# Patient Record
Sex: Female | Born: 1945 | Race: Black or African American | Hispanic: No | Smoking: Former smoker
Health system: Southern US, Community
[De-identification: ages and names within clinical notes are randomized; demographics above are authoritative.]

## PROBLEM LIST (undated history)

## (undated) DIAGNOSIS — I714 Abdominal aortic aneurysm, without rupture, unspecified: Secondary | ICD-10-CM

## (undated) DIAGNOSIS — I10 Essential (primary) hypertension: Secondary | ICD-10-CM

## (undated) DIAGNOSIS — F419 Anxiety disorder, unspecified: Secondary | ICD-10-CM

## (undated) DIAGNOSIS — E119 Type 2 diabetes mellitus without complications: Secondary | ICD-10-CM

## (undated) DIAGNOSIS — D649 Anemia, unspecified: Secondary | ICD-10-CM

## (undated) DIAGNOSIS — J45909 Unspecified asthma, uncomplicated: Secondary | ICD-10-CM

## (undated) DIAGNOSIS — E785 Hyperlipidemia, unspecified: Secondary | ICD-10-CM

## (undated) DIAGNOSIS — M1990 Unspecified osteoarthritis, unspecified site: Secondary | ICD-10-CM

## (undated) HISTORY — PX: APPENDECTOMY: SHX54

## (undated) HISTORY — DX: Type 2 diabetes mellitus without complications: E11.9

## (undated) HISTORY — DX: Abdominal aortic aneurysm, without rupture: I71.4

## (undated) HISTORY — PX: ABDOMINAL HYSTERECTOMY: SHX81

## (undated) HISTORY — DX: Anxiety disorder, unspecified: F41.9

## (undated) HISTORY — DX: Abdominal aortic aneurysm, without rupture, unspecified: I71.40

## (undated) HISTORY — DX: Hyperlipidemia, unspecified: E78.5

## (undated) HISTORY — PX: BUNIONECTOMY: SHX129

## (undated) HISTORY — DX: Essential (primary) hypertension: I10

## (undated) HISTORY — PX: GANGLION CYST EXCISION: SHX1691

## (undated) HISTORY — DX: Unspecified osteoarthritis, unspecified site: M19.90

## (undated) HISTORY — DX: Anemia, unspecified: D64.9

## (undated) HISTORY — DX: Unspecified asthma, uncomplicated: J45.909

---

## 2015-08-05 ENCOUNTER — Encounter: Payer: Self-pay | Admitting: Pediatrics

## 2015-08-05 ENCOUNTER — Ambulatory Visit (INDEPENDENT_AMBULATORY_CARE_PROVIDER_SITE_OTHER): Payer: Medicare Other | Admitting: Pediatrics

## 2015-08-05 VITALS — BP 118/83 | HR 71 | Temp 99.3°F | Ht 64.25 in | Wt 181.0 lb

## 2015-08-05 DIAGNOSIS — Z1159 Encounter for screening for other viral diseases: Secondary | ICD-10-CM | POA: Diagnosis not present

## 2015-08-05 DIAGNOSIS — H269 Unspecified cataract: Secondary | ICD-10-CM

## 2015-08-05 DIAGNOSIS — M19071 Primary osteoarthritis, right ankle and foot: Secondary | ICD-10-CM | POA: Diagnosis not present

## 2015-08-05 DIAGNOSIS — J4530 Mild persistent asthma, uncomplicated: Secondary | ICD-10-CM

## 2015-08-05 DIAGNOSIS — E538 Deficiency of other specified B group vitamins: Secondary | ICD-10-CM | POA: Insufficient documentation

## 2015-08-05 DIAGNOSIS — I10 Essential (primary) hypertension: Secondary | ICD-10-CM | POA: Diagnosis not present

## 2015-08-05 DIAGNOSIS — Z1239 Encounter for other screening for malignant neoplasm of breast: Secondary | ICD-10-CM | POA: Diagnosis not present

## 2015-08-05 DIAGNOSIS — M069 Rheumatoid arthritis, unspecified: Secondary | ICD-10-CM | POA: Insufficient documentation

## 2015-08-05 MED ORDER — ALBUTEROL SULFATE HFA 108 (90 BASE) MCG/ACT IN AERS: 2.0000 | INHALATION_SPRAY | Freq: Four times a day (QID) | RESPIRATORY_TRACT | Status: DC | PRN

## 2015-08-05 NOTE — Progress Notes (Signed)
Subjective:    Patient ID: Anne Strong, female    DOB: [birth year], 70 y.o.   MRN: [id number]  CC: follow up multiple med problems, establish care  HPI: Anne Strong is a 70 y.o. female presenting for New Patient (Initial Visit)  Moved in July from [place].  H/o B12 def: on B12 shots.   Dexa scan: In May or June IN [place], says was normal  Arthritis: takes aleve, pain in lower back when she first gets up in the morning, also in R hand, and R ankle with OA  Recently treated for a tooth infection with clindamycin at urgent care  Asthma: breathing well controlled, has been in hospital before but years  BMI: going to the [facility] 2-3 days a week  H/o "funny heart rhythm". Not on a blood thinner.  Colonoscopy last 2 years  Has had one pneumonia shot   Depression screen [facility] 2/9 08/05/2015  Decreased Interest 0  Down, Depressed, Hopeless 0  PHQ - 2 Score 0      ROS: All systems negative other than what is in HPI  Past Medical History  Diagnosis Date  . Anxiety   . Asthma   . Hypertension   . Hyperlipidemia   . Diabetes mellitus without complication ([place])   . Anemia   . Arthritis    Social History   Social History  . Marital Status: Divorced    Spouse Name: N/A  . Number of Children: N/A  . Years of Education: N/A   Occupational History  . Not on file.   Social History Main Topics  . Smoking status: Former Smoker    Types: Cigarettes    Quit date: 08/04/2012  . Smokeless tobacco: Not on file  . Alcohol Use: No  . Drug Use: No  . Sexual Activity: Not on file   Other Topics Concern  . Not on file   Social History Narrative  . No narrative on file   Fam hx: parents with HTN  Current Outpatient Prescriptions  Medication Sig Dispense Refill  . albuterol (PROVENTIL HFA;VENTOLIN HFA) 108 (90 Base) MCG/ACT inhaler Inhale 2 puffs into the lungs every 6 (six) hours as needed for wheezing or shortness of breath. 1 Inhaler 2  . amLODipine (NORVASC) 5 MG tablet       . calcium-vitamin D (OSCAL WITH D) 500-200 MG-UNIT tablet Take 1 tablet by mouth.    . metoprolol succinate (TOPROL-XL) 25 MG 24 hr tablet     . mometasone-formoterol (DULERA) 100-5 MCG/ACT AERO Inhale 2 puffs into the lungs 2 (two) times daily.    . montelukast (SINGULAIR) 10 MG tablet     . naproxen sodium (ANAPROX) 220 MG tablet Take 220 mg by mouth 2 (two) times daily with a meal.    . pravastatin (PRAVACHOL) 40 MG tablet     . ramipril (ALTACE) 10 MG capsule      No current facility-administered medications for this visit.       Objective:    BP 118/83 mmHg  Pulse 71  Temp(Src) 99.3 F (37.4 C) (Oral)  Ht 5' 4.25" (1.632 m)  Wt 181 lb (82.101 kg)  BMI 30.83 kg/m2  Wt Readings from Last 3 Encounters:  08/05/15 181 lb (82.101 kg)     Gen: NAD, alert, cooperative with exam, NCAT EYES: EOMI, no scleral injection or icterus ENT:  TMs pearly gray b/l, OP without erythema LYMPH: no cervical LAD CV: NRRR, normal S1/S2, no murmur, distal pulses 2+  b/l Resp: CTABL, no wheezes, normal WOB Abd: +BS, soft, NTND. no guarding or organomegaly Ext: No edema, warm Neuro: Alert and oriented, strength equal b/l UE and LE, coordination grossly normal MSK: normal muscle bulk     Assessment & Plan:    Aeriel was seen today for multiple med problem f/u.  Diagnoses and all orders for this visit:  Asthma, mild persistent, uncomplicated Was using controller med as a prn. Start Dulera twice a day every day. Albuterol as needed. Stop pulmicort. -     albuterol (PROVENTIL HFA;VENTOLIN HFA) 108 (90 Base) MCG/ACT inhaler; Inhale 2 puffs into the lungs every 6 (six) hours as needed for wheezing or shortness of breath.  B12 deficiency Was getting injections, last 9 mo ago, will check level. -     Vitamin B12  Osteoarthritis of right ankle, unspecified osteoarthritis type Naproxen as needed  Need for hepatitis C screening test -     Hepatitis C antibody  Screening for breast cancer -      MM DIGITAL SCREENING BILATERAL; Future  Essential hypertension Continue current meds. Will check below labs.  -     CMP14+EGFR -     CBC  Cataract -     Ambulatory referral to Ophthalmology  F/u with dentist for tooth infection. FInish abx from Urgent care   Follow up plan: Return in about 4 weeks (around 09/02/2015) for f/u medical problems. F/u ?heart problems after get records from [place], may need EKG, ?atrial fibrillation from pt's description vs PVCs  Assunta Found, MD [facility] 08/05/2015, 7:29 PM

## 2015-08-06 LAB — CBC
HEMATOCRIT: 33.1 % — AB (ref 34.0–46.6)
HEMOGLOBIN: 10.5 g/dL — AB (ref 11.1–15.9)
MCH: 27.6 pg (ref 26.6–33.0)
MCHC: 31.7 g/dL (ref 31.5–35.7)
MCV: 87 fL (ref 79–97)
Platelets: 216 10*3/uL (ref 150–379)
RBC: 3.8 x10E6/uL (ref 3.77–5.28)
RDW: 15 % (ref 12.3–15.4)
WBC: 5.8 10*3/uL (ref 3.4–10.8)

## 2015-08-06 LAB — VITAMIN B12: Vitamin B-12: 482 pg/mL (ref 211–946)

## 2015-08-06 LAB — CMP14+EGFR
ALT: 9 IU/L (ref 0–32)
AST: 12 IU/L (ref 0–40)
Albumin/Globulin Ratio: 1.3 (ref 1.1–2.5)
Albumin: 3.7 g/dL (ref 3.6–4.8)
Alkaline Phosphatase: 57 IU/L (ref 39–117)
BUN/Creatinine Ratio: 21 (ref 11–26)
BUN: 27 mg/dL (ref 8–27)
Bilirubin Total: <0.2 mg/dL (ref 0.0–1.2)
CALCIUM: 9.1 mg/dL (ref 8.7–10.3)
CHLORIDE: 99 mmol/L (ref 96–106)
CO2: 23 mmol/L (ref 18–29)
Creatinine, Ser: 1.3 mg/dL — ABNORMAL HIGH (ref 0.57–1.00)
GFR, EST AFRICAN AMERICAN: 49 mL/min/{1.73_m2} — AB (ref >59)
GFR, EST NON AFRICAN AMERICAN: 43 mL/min/{1.73_m2} — AB (ref >59)
GLUCOSE: 98 mg/dL (ref 65–99)
Globulin, Total: 2.9 g/dL (ref 1.5–4.5)
Potassium: 4.8 mmol/L (ref 3.5–5.2)
Sodium: 139 mmol/L (ref 134–144)
TOTAL PROTEIN: 6.6 g/dL (ref 6.0–8.5)

## 2015-08-06 LAB — HEPATITIS C ANTIBODY

## 2015-08-06 LAB — PLEASE NOTE

## 2015-08-09 ENCOUNTER — Telehealth: Payer: Self-pay | Admitting: Pediatrics

## 2015-08-09 MED ORDER — ALBUTEROL SULFATE HFA 108 (90 BASE) MCG/ACT IN AERS: 2.0000 | INHALATION_SPRAY | Freq: Four times a day (QID) | RESPIRATORY_TRACT | Status: DC | PRN

## 2015-08-09 NOTE — Telephone Encounter (Signed)
I resent in a generic albuterol. The pharmacist may be able to tell you which inhaler is preferred by your insurance company and if it isnt this new one I sent in let me know which one is best and I can send that in. Unfortunately I can't tell from my end what is covered and what is not by her insurance so sometimes we have to do trial and error with the pharmacy, but the pharmacist should be able to help if this doesn't go through.

## 2015-08-11 NOTE — Telephone Encounter (Signed)
Pt aware other medication has been sent to pharmacy

## 2015-08-23 ENCOUNTER — Encounter: Payer: Self-pay | Admitting: Pediatrics

## 2015-08-23 DIAGNOSIS — I700 Atherosclerosis of aorta: Secondary | ICD-10-CM | POA: Insufficient documentation

## 2015-09-16 ENCOUNTER — Ambulatory Visit (INDEPENDENT_AMBULATORY_CARE_PROVIDER_SITE_OTHER): Payer: Medicare Other | Admitting: Pediatrics

## 2015-09-16 ENCOUNTER — Encounter: Payer: Self-pay | Admitting: Pediatrics

## 2015-09-16 ENCOUNTER — Encounter (INDEPENDENT_AMBULATORY_CARE_PROVIDER_SITE_OTHER): Payer: Self-pay

## 2015-09-16 VITALS — BP 104/68 | HR 75 | Temp 98.7°F | Ht 64.25 in | Wt 186.2 lb

## 2015-09-16 DIAGNOSIS — J4530 Mild persistent asthma, uncomplicated: Secondary | ICD-10-CM

## 2015-09-16 DIAGNOSIS — I10 Essential (primary) hypertension: Secondary | ICD-10-CM | POA: Diagnosis not present

## 2015-09-16 DIAGNOSIS — I700 Atherosclerosis of aorta: Secondary | ICD-10-CM | POA: Diagnosis not present

## 2015-09-16 DIAGNOSIS — Z1239 Encounter for other screening for malignant neoplasm of breast: Secondary | ICD-10-CM | POA: Diagnosis not present

## 2015-09-16 MED ORDER — METOPROLOL SUCCINATE ER 25 MG PO TB24: 25.0000 mg | ORAL_TABLET | Freq: Every day | ORAL | Status: DC

## 2015-09-16 MED ORDER — AMLODIPINE BESYLATE 5 MG PO TABS: 5.0000 mg | ORAL_TABLET | Freq: Every day | ORAL | Status: DC

## 2015-09-16 MED ORDER — RAMIPRIL 10 MG PO CAPS: 10.0000 mg | ORAL_CAPSULE | Freq: Every day | ORAL | Status: DC

## 2015-09-16 MED ORDER — PRAVASTATIN SODIUM 40 MG PO TABS: 40.0000 mg | ORAL_TABLET | Freq: Every day | ORAL | Status: DC

## 2015-09-16 MED ORDER — MONTELUKAST SODIUM 10 MG PO TABS: 10.0000 mg | ORAL_TABLET | Freq: Every day | ORAL | Status: DC

## 2015-09-16 NOTE — Progress Notes (Signed)
Subjective:    Patient ID: Anne Strong, female    DOB: Nov 21, 1945, 70 y.o.   MRN: [id number]  CC: Follow-up multiple med problems  HPI: Anne Strong is a 70 y.o. female presenting for Follow-up  Doing well No further problems with breathing Needed albuterol in Dec when visiting family Anne Strong was too expensive from pharmacy Not needed albuterol recently No fevers  No headaches or vision changes  Taking cholesterol medicine regularly   Depression screen [facility] 2/9 09/16/2015 08/05/2015  Decreased Interest 0 0  Down, Depressed, Hopeless 0 0  PHQ - 2 Score 0 0     Relevant past medical, surgical, family and social history reviewed and updated as indicated. Interim medical history since our last visit reviewed. Allergies and medications reviewed and updated.    ROS: Per HPI unless specifically indicated above  History  Smoking status  . Former Smoker  . Types: Cigarettes  . Quit date: 08/04/2012  Smokeless tobacco  . Not on file    Past Medical History Patient Active Problem List   Diagnosis Date Noted  . Aortic atherosclerosis ([place]) 08/23/2015  . B12 deficiency 08/05/2015  . Rheumatoid arthritis ([place]) 08/05/2015  . Asthma, mild persistent 08/05/2015  . Essential hypertension 08/05/2015  . Cataract 08/05/2015       Objective:    BP 104/68 mmHg  Pulse 75  Temp(Src) 98.7 F (37.1 C) (Oral)  Ht 5' 4.25" (1.632 m)  Wt 186 lb 3.2 oz (84.46 kg)  BMI 31.71 kg/m2  Wt Readings from Last 3 Encounters:  09/16/15 186 lb 3.2 oz (84.46 kg)  08/05/15 181 lb (82.101 kg)     Gen: NAD, alert, cooperative with exam, NCAT EYES: EOMI, no scleral injection or icterus ENT:  TMs pearly gray b/l, OP without erythema LYMPH: no cervical LAD CV: NRRR, normal S1/S2, no murmur, distal pulses 2+ b/l Resp: CTABL, no wheezes, normal WOB Abd: +BS, soft, NTND. Ext: No edema, warm Neuro: Alert and oriented, strength equal b/l UE and LE, coordination grossly normal MSK: normal  muscle bulk     Assessment & Plan:    Delijah was seen today for follow-up multiple med problems.  Diagnoses and all orders for this visit:  Asthma, mild persistent, uncomplicated Well controlled if needing albuterol more regularly will let me know. Not taking dulera now, pt to check with insurance company to see how much it costs from mail order. -     montelukast (SINGULAIR) 10 MG tablet; Take 1 tablet (10 mg total) by mouth at bedtime.  Essential hypertension Well controlled, may need to be on less BP medicine. No symptoms of hypotension. Will check BP at home. -     metoprolol succinate (TOPROL-XL) 25 MG 24 hr tablet; Take 1 tablet (25 mg total) by mouth daily. -     amLODipine (NORVASC) 5 MG tablet; Take 1 tablet (5 mg total) by mouth daily. -     ramipril (ALTACE) 10 MG capsule; Take 1 capsule (10 mg total) by mouth daily.  Aortic atherosclerosis (HCC) -     pravastatin (PRAVACHOL) 40 MG tablet; Take 1 tablet (40 mg total) by mouth daily.  Screening for breast cancer -     MM DIGITAL SCREENING BILATERAL; Future  Screening colon ca pt to get records from [facility]  Follow up plan: Return in about 5 months (around 02/16/2016) for Med f/u in 5 mo, mammogram bus schedule.  Assunta Found, MD [facility] 09/16/2015, 2:31 PM

## 2015-10-07 ENCOUNTER — Other Ambulatory Visit: Payer: Self-pay | Admitting: Pediatrics

## 2015-10-07 DIAGNOSIS — I10 Essential (primary) hypertension: Secondary | ICD-10-CM

## 2015-10-07 MED ORDER — METOPROLOL SUCCINATE ER 25 MG PO TB24: 25.0000 mg | ORAL_TABLET | Freq: Every day | ORAL | Status: DC

## 2015-10-07 NOTE — Telephone Encounter (Signed)
Printed rx and faxed

## 2016-01-03 ENCOUNTER — Encounter: Payer: Medicare Other | Admitting: *Deleted

## 2016-01-14 ENCOUNTER — Telehealth: Payer: Self-pay | Admitting: Pediatrics

## 2016-01-14 ENCOUNTER — Other Ambulatory Visit: Payer: Self-pay | Admitting: *Deleted

## 2016-01-14 DIAGNOSIS — I10 Essential (primary) hypertension: Secondary | ICD-10-CM

## 2016-01-14 DIAGNOSIS — I700 Atherosclerosis of aorta: Secondary | ICD-10-CM

## 2016-01-14 DIAGNOSIS — J4530 Mild persistent asthma, uncomplicated: Secondary | ICD-10-CM

## 2016-01-14 MED ORDER — ALBUTEROL SULFATE HFA 108 (90 BASE) MCG/ACT IN AERS: 2.0000 | INHALATION_SPRAY | Freq: Four times a day (QID) | RESPIRATORY_TRACT | Status: DC | PRN

## 2016-01-14 MED ORDER — PRAVASTATIN SODIUM 40 MG PO TABS: 40.0000 mg | ORAL_TABLET | Freq: Every day | ORAL | Status: DC

## 2016-01-14 MED ORDER — METOPROLOL SUCCINATE ER 25 MG PO TB24: 25.0000 mg | ORAL_TABLET | Freq: Every day | ORAL | Status: DC

## 2016-01-14 MED ORDER — RAMIPRIL 10 MG PO CAPS: 10.0000 mg | ORAL_CAPSULE | Freq: Every day | ORAL | Status: DC

## 2016-01-14 MED ORDER — AMLODIPINE BESYLATE 5 MG PO TABS: 5.0000 mg | ORAL_TABLET | Freq: Every day | ORAL | Status: DC

## 2016-01-14 MED ORDER — MONTELUKAST SODIUM 10 MG PO TABS: 10.0000 mg | ORAL_TABLET | Freq: Every day | ORAL | Status: DC

## 2016-01-14 NOTE — Telephone Encounter (Signed)
Patient needs refills of all her medications sent to [facility]  Patient last seen March 2017, please advise.

## 2016-02-04 ENCOUNTER — Telehealth: Payer: Self-pay | Admitting: Pediatrics

## 2016-02-14 ENCOUNTER — Encounter: Payer: Self-pay | Admitting: Pediatrics

## 2016-02-14 ENCOUNTER — Ambulatory Visit (INDEPENDENT_AMBULATORY_CARE_PROVIDER_SITE_OTHER): Payer: Medicare Other | Admitting: Pediatrics

## 2016-02-14 VITALS — BP 117/78 | HR 95 | Temp 98.0°F | Ht 64.25 in | Wt 184.2 lb

## 2016-02-14 DIAGNOSIS — M05731 Rheumatoid arthritis with rheumatoid factor of right wrist without organ or systems involvement: Secondary | ICD-10-CM

## 2016-02-14 DIAGNOSIS — J4530 Mild persistent asthma, uncomplicated: Secondary | ICD-10-CM | POA: Diagnosis not present

## 2016-02-14 DIAGNOSIS — I10 Essential (primary) hypertension: Secondary | ICD-10-CM | POA: Diagnosis not present

## 2016-02-14 DIAGNOSIS — I700 Atherosclerosis of aorta: Secondary | ICD-10-CM

## 2016-02-14 DIAGNOSIS — D649 Anemia, unspecified: Secondary | ICD-10-CM

## 2016-02-14 DIAGNOSIS — E538 Deficiency of other specified B group vitamins: Secondary | ICD-10-CM | POA: Diagnosis not present

## 2016-02-14 MED ORDER — METOPROLOL SUCCINATE ER 25 MG PO TB24: 25.0000 mg | ORAL_TABLET | Freq: Every day | ORAL | 0 refills | Status: DC

## 2016-02-14 MED ORDER — AMLODIPINE BESYLATE 5 MG PO TABS: 5.0000 mg | ORAL_TABLET | Freq: Every day | ORAL | 0 refills | Status: DC

## 2016-02-14 MED ORDER — MONTELUKAST SODIUM 10 MG PO TABS: 10.0000 mg | ORAL_TABLET | Freq: Every day | ORAL | 0 refills | Status: DC

## 2016-02-14 MED ORDER — RAMIPRIL 10 MG PO CAPS: 10.0000 mg | ORAL_CAPSULE | Freq: Every day | ORAL | 0 refills | Status: DC

## 2016-02-14 MED ORDER — PRAVASTATIN SODIUM 40 MG PO TABS: 40.0000 mg | ORAL_TABLET | Freq: Every day | ORAL | 0 refills | Status: DC

## 2016-02-14 NOTE — Progress Notes (Addendum)
    Subjective:    Patient ID: [name], female    DOB: [birth year], 70 y.o.   MRN: [id number]  CC: Follow-up multiple med problems  HPI: JACQUETTA POLHAMUS is a 70 y.o. female presenting for Follow-up  RA: affected R ankle, R wrist No recent symptoms No swelling joints  Breathing has been fine Using albuterol daily, not on controller medicine anymore  Normal stooling Good energy levels Has been with grandkids a lot this summer  No headaches, vision changes, or chest pain  Depression screen [facility] 2/9 02/14/2016 09/16/2015 08/05/2015  Decreased Interest 0 0 0  Down, Depressed, Hopeless 0 0 0  PHQ - 2 Score 0 0 0     Relevant past medical, surgical, family and social history reviewed and updated. Interim medical history since our last visit reviewed. Allergies and medications reviewed and updated.  History  Smoking Status  . Former Smoker  . Types: Cigarettes  . Quit date: 08/04/2012  Smokeless Tobacco  . Never Used    ROS: Per HPI      Objective:    BP 117/78 (BP Location: Right Arm, Patient Position: Sitting, Cuff Size: Large)   Pulse 95   Temp 98 F (36.7 C) (Oral)   Ht 5' 4.25" (1.632 m)   Wt 184 lb 3.2 oz (83.6 kg)   BMI 31.37 kg/m   Wt Readings from Last 3 Encounters:  02/14/16 184 lb 3.2 oz (83.6 kg)  09/16/15 186 lb 3.2 oz (84.5 kg)  08/05/15 181 lb (82.1 kg)     Gen: NAD, alert, cooperative with exam, NCAT EYES: EOMI, no scleral injection or icterus CV: NRRR, normal S1/S2, no murmur, distal pulses 2+ b/l Resp: CTABL, no wheezes, normal WOB Abd: +BS, soft, NTND. no guarding or organomegaly Ext: No edema, warm Neuro: Alert and oriented, strength equal b/l UE and LE, coordination grossly normal MSK: normal muscle bulk     Assessment & Plan:    Merrily was seen today for follow-up multiple med problems  Diagnoses and all orders for this visit:  Essential hypertension Well controlled Cont current meds -     BMP8+EGFR -     ramipril (ALTACE) 10  MG capsule; Take 1 capsule (10 mg total) by mouth daily. -     amLODipine (NORVASC) 5 MG tablet; Take 1 tablet (5 mg total) by mouth daily. -     metoprolol succinate (TOPROL-XL) 25 MG 24 hr tablet; Take 1 tablet (25 mg total) by mouth daily.  Asthma, mild persistent, uncomplicated Well controlled Use albuteorl only as needed -     montelukast (SINGULAIR) 10 MG tablet; Take 1 tablet (10 mg total) by mouth at bedtime.  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  Anemia, unspecified anemia type -     CBC with Differential/Platelet   Follow up plan: Return in about 6 months (around 08/16/2016).  Assunta Found, MD [facility] 02/14/2016, 2:14 PM

## 2016-02-14 NOTE — Patient Instructions (Signed)
Let me know if our medication list is not correct for you once you see the medication bottles at home.

## 2016-02-14 NOTE — Telephone Encounter (Signed)
Pt did not return call, will not order until we hear from her

## 2016-02-15 LAB — CBC WITH DIFFERENTIAL/PLATELET
Basophils Absolute: 0 10*3/uL (ref 0.0–0.2)
Basos: 1 %
EOS (ABSOLUTE): 0.2 10*3/uL (ref 0.0–0.4)
EOS: 4 %
HEMATOCRIT: 34.4 % (ref 34.0–46.6)
HEMOGLOBIN: 10.7 g/dL — AB (ref 11.1–15.9)
IMMATURE GRANS (ABS): 0 10*3/uL (ref 0.0–0.1)
IMMATURE GRANULOCYTES: 0 %
LYMPHS ABS: 1 10*3/uL (ref 0.7–3.1)
Lymphs: 20 %
MCH: 27.9 pg (ref 26.6–33.0)
MCHC: 31.1 g/dL — AB (ref 31.5–35.7)
MCV: 90 fL (ref 79–97)
MONOCYTES: 6 %
Monocytes Absolute: 0.3 10*3/uL (ref 0.1–0.9)
Neutrophils Absolute: 3.6 10*3/uL (ref 1.4–7.0)
Neutrophils: 69 %
Platelets: 225 10*3/uL (ref 150–379)
RBC: 3.84 x10E6/uL (ref 3.77–5.28)
RDW: 14.7 % (ref 12.3–15.4)
WBC: 5.2 10*3/uL (ref 3.4–10.8)

## 2016-02-15 LAB — BMP8+EGFR
BUN / CREAT RATIO: 19 (ref 12–28)
BUN: 24 mg/dL (ref 8–27)
CO2: 24 mmol/L (ref 18–29)
CREATININE: 1.25 mg/dL — AB (ref 0.57–1.00)
Calcium: 9.3 mg/dL (ref 8.7–10.3)
Chloride: 100 mmol/L (ref 96–106)
GFR calc Af Amer: 51 mL/min/{1.73_m2} — ABNORMAL LOW (ref >59)
GFR calc non Af Amer: 44 mL/min/{1.73_m2} — ABNORMAL LOW (ref >59)
GLUCOSE: 97 mg/dL (ref 65–99)
Potassium: 4.9 mmol/L (ref 3.5–5.2)
Sodium: 140 mmol/L (ref 134–144)

## 2016-02-16 ENCOUNTER — Other Ambulatory Visit: Payer: Self-pay

## 2016-02-16 DIAGNOSIS — I10 Essential (primary) hypertension: Secondary | ICD-10-CM

## 2016-02-16 DIAGNOSIS — I700 Atherosclerosis of aorta: Secondary | ICD-10-CM

## 2016-02-16 MED ORDER — RAMIPRIL 10 MG PO CAPS: 10.0000 mg | ORAL_CAPSULE | Freq: Every day | ORAL | 1 refills | Status: DC

## 2016-02-16 MED ORDER — PRAVASTATIN SODIUM 40 MG PO TABS: 40.0000 mg | ORAL_TABLET | Freq: Every day | ORAL | 1 refills | Status: DC

## 2016-02-16 MED ORDER — METOPROLOL SUCCINATE ER 25 MG PO TB24: 25.0000 mg | ORAL_TABLET | Freq: Every day | ORAL | 1 refills | Status: DC

## 2016-02-16 MED ORDER — AMLODIPINE BESYLATE 5 MG PO TABS: 5.0000 mg | ORAL_TABLET | Freq: Every day | ORAL | 1 refills | Status: DC

## 2016-02-16 MED ORDER — ALBUTEROL SULFATE HFA 108 (90 BASE) MCG/ACT IN AERS: 2.0000 | INHALATION_SPRAY | Freq: Four times a day (QID) | RESPIRATORY_TRACT | 3 refills | Status: DC | PRN

## 2016-06-13 ENCOUNTER — Other Ambulatory Visit: Payer: Self-pay | Admitting: Pediatrics

## 2016-06-13 DIAGNOSIS — J4530 Mild persistent asthma, uncomplicated: Secondary | ICD-10-CM

## 2016-07-04 ENCOUNTER — Other Ambulatory Visit: Payer: Self-pay

## 2016-07-04 DIAGNOSIS — J4530 Mild persistent asthma, uncomplicated: Secondary | ICD-10-CM

## 2016-07-04 MED ORDER — MONTELUKAST SODIUM 10 MG PO TABS: 10.0000 mg | ORAL_TABLET | Freq: Every day | ORAL | 0 refills | Status: DC

## 2016-07-20 ENCOUNTER — Other Ambulatory Visit: Payer: Self-pay | Admitting: *Deleted

## 2016-07-20 DIAGNOSIS — J4530 Mild persistent asthma, uncomplicated: Secondary | ICD-10-CM

## 2016-07-20 MED ORDER — MONTELUKAST SODIUM 10 MG PO TABS: 10.0000 mg | ORAL_TABLET | Freq: Every day | ORAL | 0 refills | Status: DC

## 2016-08-16 ENCOUNTER — Ambulatory Visit (INDEPENDENT_AMBULATORY_CARE_PROVIDER_SITE_OTHER): Payer: Medicare Other | Admitting: Pediatrics

## 2016-08-16 ENCOUNTER — Encounter: Payer: Self-pay | Admitting: Pediatrics

## 2016-08-16 ENCOUNTER — Encounter (INDEPENDENT_AMBULATORY_CARE_PROVIDER_SITE_OTHER): Payer: Self-pay

## 2016-08-16 VITALS — BP 102/74 | HR 71 | Temp 97.7°F | Ht 64.0 in | Wt 185.0 lb

## 2016-08-16 DIAGNOSIS — J4530 Mild persistent asthma, uncomplicated: Secondary | ICD-10-CM | POA: Diagnosis not present

## 2016-08-16 DIAGNOSIS — E538 Deficiency of other specified B group vitamins: Secondary | ICD-10-CM | POA: Diagnosis not present

## 2016-08-16 DIAGNOSIS — M7061 Trochanteric bursitis, right hip: Secondary | ICD-10-CM

## 2016-08-16 DIAGNOSIS — M7062 Trochanteric bursitis, left hip: Secondary | ICD-10-CM

## 2016-08-16 DIAGNOSIS — M05731 Rheumatoid arthritis with rheumatoid factor of right wrist without organ or systems involvement: Secondary | ICD-10-CM | POA: Diagnosis not present

## 2016-08-16 DIAGNOSIS — I10 Essential (primary) hypertension: Secondary | ICD-10-CM

## 2016-08-16 NOTE — Progress Notes (Signed)
  Subjective:   Patient ID: Anne Strong, female    DOB: 1946/06/29, 71 y.o.   MRN: [id number] CC: Follow up chronic medical problems  HPI: [name] is a 71 y.o. female presenting for Follow up chronic medical problems  [facility]  Pain on outside of both hips after exercising Feels sore in the morning Aleve helps, taking two two times a day  HTN: taking meds regularly No dizziness, CP, HA  No trouble with RA recently, last trouble was years ago per pt Says she was diagnosed in [place] Occasionally will feel stiffness in the morning, no joint pain, swelling No joint changes  Taking oral B12 Regular stooling breathing has been fine  Hip pain comes and goes, hurts on sides of hips Worse after working out at [employer] as above  Relevant past medical, surgical, family and social history reviewed. Allergies and medications reviewed and updated. History  Smoking Status  . Former Smoker  . Types: Cigarettes  . Quit date: 08/04/2012  Smokeless Tobacco  . Never Used   ROS: Per HPI   Objective:    BP 102/74   Pulse 71   Temp 97.7 F (36.5 C) (Oral)   Ht '5\' 4"'$  (1.626 m)   Wt 185 lb (83.9 kg)   BMI 31.76 kg/m   Wt Readings from Last 3 Encounters:  08/16/16 185 lb (83.9 kg)  02/14/16 184 lb 3.2 oz (83.6 kg)  09/16/15 186 lb 3.2 oz (84.5 kg)    Gen: NAD, alert, cooperative with exam, NCAT EYES: EOMI, no conjunctival injection, or no icterus ENT:  TMs pearly gray b/l, OP without erythema LYMPH: no cervical LAD CV: NRRR, normal S1/S2, no murmur, distal pulses 2+ b/l Resp: CTABL, no wheezes, normal WOB Abd: +BS, soft, NTND. no guarding or organomegaly Ext: No edema, warm Neuro: Alert and oriented, strength equal b/l UE and LE, coordination grossly normal MSK: normal ROM b/l hips, no pain with int/ext rotation, TTP over greater tronchanter b/l  Assessment & Plan:  Laquasia was seen today for follow up chronic medical problems.  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 Assunta Found, MD [place]

## 2016-08-16 NOTE — Patient Instructions (Signed)

## 2016-08-17 DIAGNOSIS — M7061 Trochanteric bursitis, right hip: Secondary | ICD-10-CM | POA: Insufficient documentation

## 2016-08-17 DIAGNOSIS — M7062 Trochanteric bursitis, left hip: Secondary | ICD-10-CM

## 2016-08-17 LAB — CBC WITH DIFFERENTIAL/PLATELET
Basophils Absolute: 0 10*3/uL (ref 0.0–0.2)
Basos: 1 %
EOS (ABSOLUTE): 0.2 10*3/uL (ref 0.0–0.4)
Eos: 4 %
HEMATOCRIT: 34.2 % (ref 34.0–46.6)
HEMOGLOBIN: 10.9 g/dL — AB (ref 11.1–15.9)
IMMATURE GRANULOCYTES: 0 %
Immature Grans (Abs): 0 10*3/uL (ref 0.0–0.1)
Lymphocytes Absolute: 1 10*3/uL (ref 0.7–3.1)
Lymphs: 19 %
MCH: 28.3 pg (ref 26.6–33.0)
MCHC: 31.9 g/dL (ref 31.5–35.7)
MCV: 89 fL (ref 79–97)
MONOCYTES: 8 %
Monocytes Absolute: 0.4 10*3/uL (ref 0.1–0.9)
NEUTROS PCT: 68 %
Neutrophils Absolute: 3.5 10*3/uL (ref 1.4–7.0)
Platelets: 229 10*3/uL (ref 150–379)
RBC: 3.85 x10E6/uL (ref 3.77–5.28)
RDW: 14.3 % (ref 12.3–15.4)
WBC: 5.1 10*3/uL (ref 3.4–10.8)

## 2016-08-17 LAB — BMP8+EGFR
BUN/Creatinine Ratio: 21 (ref 12–28)
BUN: 35 mg/dL — ABNORMAL HIGH (ref 8–27)
CALCIUM: 9.5 mg/dL (ref 8.7–10.3)
CO2: 27 mmol/L (ref 18–29)
CREATININE: 1.64 mg/dL — AB (ref 0.57–1.00)
Chloride: 99 mmol/L (ref 96–106)
GFR calc Af Amer: 37 mL/min/{1.73_m2} — ABNORMAL LOW (ref >59)
GFR, EST NON AFRICAN AMERICAN: 32 mL/min/{1.73_m2} — AB (ref >59)
Glucose: 87 mg/dL (ref 65–99)
Potassium: 5.1 mmol/L (ref 3.5–5.2)
Sodium: 141 mmol/L (ref 134–144)

## 2016-08-17 LAB — VITAMIN B12: Vitamin B-12: >2000 pg/mL — ABNORMAL HIGH (ref 232–1245)

## 2016-08-18 ENCOUNTER — Other Ambulatory Visit: Payer: Self-pay | Admitting: Pediatrics

## 2016-08-25 ENCOUNTER — Other Ambulatory Visit: Payer: Self-pay

## 2016-08-25 DIAGNOSIS — J4530 Mild persistent asthma, uncomplicated: Secondary | ICD-10-CM

## 2016-08-25 MED ORDER — MONTELUKAST SODIUM 10 MG PO TABS: 10.0000 mg | ORAL_TABLET | Freq: Every day | ORAL | 0 refills | Status: DC

## 2016-11-08 ENCOUNTER — Ambulatory Visit (INDEPENDENT_AMBULATORY_CARE_PROVIDER_SITE_OTHER): Payer: Medicare Other | Admitting: Pediatrics

## 2016-11-08 ENCOUNTER — Ambulatory Visit (INDEPENDENT_AMBULATORY_CARE_PROVIDER_SITE_OTHER): Payer: Medicare Other

## 2016-11-08 ENCOUNTER — Encounter: Payer: Self-pay | Admitting: Pediatrics

## 2016-11-08 VITALS — BP 112/71 | HR 58 | Temp 98.8°F | Ht 64.0 in | Wt 184.0 lb

## 2016-11-08 DIAGNOSIS — E785 Hyperlipidemia, unspecified: Secondary | ICD-10-CM | POA: Diagnosis not present

## 2016-11-08 DIAGNOSIS — I10 Essential (primary) hypertension: Secondary | ICD-10-CM | POA: Diagnosis not present

## 2016-11-08 DIAGNOSIS — M25552 Pain in left hip: Secondary | ICD-10-CM

## 2016-11-08 DIAGNOSIS — H269 Unspecified cataract: Secondary | ICD-10-CM

## 2016-11-08 DIAGNOSIS — Z1211 Encounter for screening for malignant neoplasm of colon: Secondary | ICD-10-CM | POA: Diagnosis not present

## 2016-11-08 DIAGNOSIS — I700 Atherosclerosis of aorta: Secondary | ICD-10-CM

## 2016-11-08 LAB — URINALYSIS, COMPLETE
BILIRUBIN UA: NEGATIVE
Glucose, UA: NEGATIVE
Ketones, UA: NEGATIVE
LEUKOCYTES UA: NEGATIVE
Nitrite, UA: NEGATIVE
PH UA: 5.5 (ref 5.0–7.5)
PROTEIN UA: NEGATIVE
RBC UA: NEGATIVE
Specific Gravity, UA: <1.005 — ABNORMAL LOW (ref 1.005–1.030)
Urobilinogen, Ur: 0.2 mg/dL (ref 0.2–1.0)

## 2016-11-08 LAB — MICROSCOPIC EXAMINATION
BACTERIA UA: NONE SEEN
Epithelial Cells (non renal): NONE SEEN /hpf (ref 0–10)
RBC, UA: NONE SEEN /hpf (ref 0–?)
RENAL EPITHEL UA: NONE SEEN /HPF

## 2016-11-08 MED ORDER — METOPROLOL SUCCINATE ER 25 MG PO TB24: 25.0000 mg | ORAL_TABLET | Freq: Every day | ORAL | 1 refills | Status: DC

## 2016-11-08 MED ORDER — RAMIPRIL 10 MG PO CAPS: 10.0000 mg | ORAL_CAPSULE | Freq: Every day | ORAL | 1 refills | Status: DC

## 2016-11-08 MED ORDER — AMLODIPINE BESYLATE 5 MG PO TABS: 5.0000 mg | ORAL_TABLET | Freq: Every day | ORAL | 1 refills | Status: DC

## 2016-11-08 MED ORDER — PRAVASTATIN SODIUM 40 MG PO TABS: 40.0000 mg | ORAL_TABLET | Freq: Every day | ORAL | 1 refills | Status: DC

## 2016-11-08 NOTE — Progress Notes (Signed)
  Subjective:   Patient ID: Anne Strong, female    DOB: 02-01-1946, 71 y.o.   MRN: [id number] CC: Hypertension (6 mos ckup)  HPI: Anne Strong is a 71 y.o. female presenting for Hypertension (6 mos ckup)  Still bothered by arthritis in both her hips Going to the gym regularly Has been in [place], 2 daughters 5 grandkids are still up there Takes aleve sometimes, not every day Bothers her mostly under buttocks or ant thigh Denies groin pain Has felt better since doing some exercises rec to her in [place] last month  HTN: no CP, no SOB Taking meds regularly  Due for f/u colonoscopy  Has cataracts in both eyes she was following for  HLD: taking meds regularly  Relevant past medical, surgical, family and social history reviewed. Allergies and medications reviewed and updated. History  Smoking Status  . Former Smoker  . Types: Cigarettes  . Quit date: 08/04/2012  Smokeless Tobacco  . Never Used   ROS: Per HPI   Objective:    BP 112/71   Pulse (!) 58   Temp 98.8 F (37.1 C) (Oral)   Ht '5\' 4"'$  (1.626 m)   Wt 184 lb (83.5 kg)   BMI 31.58 kg/m   Wt Readings from Last 3 Encounters:  11/08/16 184 lb (83.5 kg)  08/16/16 185 lb (83.9 kg)  02/14/16 184 lb 3.2 oz (83.6 kg)    Gen: NAD, alert, cooperative with exam, NCAT EYES: EOMI, no conjunctival injection, or no icterus ENT:  TMs pearly gray b/l, OP without erythema LYMPH: no cervical LAD CV: NRRR, normal S1/S2, no murmur, distal pulses 2+ b/l Resp: CTABL, no wheezes, normal WOB Abd: +BS, soft, NTND. no guarding or organomegaly Ext: No edema, warm Neuro: Alert and oriented MSK: normal Int/ext rotation of hips b/l with no pain. No ttp over greater trochanters b/l. No pain with hip flexion  Assessment & Plan:  Kaedence was seen today for hypertension.  Diagnoses and all orders for this visit:  Cataract of left eye, unspecified cataract type Needs to establish care for f/u, has not had surgery -     Ambulatory referral to  Ophthalmology  Essential hypertension Well controlled, no symptoms of hypotension, cont current meds -     BMP8+EGFR -     amLODipine (NORVASC) 5 MG tablet; Take 1 tablet (5 mg total) by mouth daily. -     ramipril (ALTACE) 10 MG capsule; Take 1 capsule (10 mg total) by mouth daily. -     metoprolol succinate (TOPROL-XL) 25 MG 24 hr tablet; Take 1 tablet (25 mg total) by mouth daily. -     Urinalysis, Complete  Left hip pain Likely muscular pain from description of pain, normal ROM, normal ROM, will get xray of hip to eval for OA -     DG HIP UNILAT W OR W/O PELVIS 2-3 VIEWS LEFT; Future  Screen for colon cancer -     Ambulatory referral to Gastroenterology  Hyperlipidemia, unspecified hyperlipidemia type Stable, cont pravastatin -     Lipid Panel  Aortic atherosclerosis (HCC) Stable, cont statin -     pravastatin (PRAVACHOL) 40 MG tablet; Take 1 tablet (40 mg total) by mouth daily.   Follow up plan: 6 mo Assunta Found, MD [place]

## 2016-11-09 ENCOUNTER — Encounter (INDEPENDENT_AMBULATORY_CARE_PROVIDER_SITE_OTHER): Payer: Self-pay | Admitting: *Deleted

## 2016-11-09 LAB — BMP8+EGFR
BUN / CREAT RATIO: 19 (ref 12–28)
BUN: 24 mg/dL (ref 8–27)
CO2: 28 mmol/L (ref 18–29)
CREATININE: 1.29 mg/dL — AB (ref 0.57–1.00)
Calcium: 9.5 mg/dL (ref 8.7–10.3)
Chloride: 99 mmol/L (ref 96–106)
GFR calc non Af Amer: 43 mL/min/{1.73_m2} — ABNORMAL LOW (ref >59)
GFR, EST AFRICAN AMERICAN: 49 mL/min/{1.73_m2} — AB (ref >59)
GLUCOSE: 81 mg/dL (ref 65–99)
Potassium: 4.9 mmol/L (ref 3.5–5.2)
SODIUM: 139 mmol/L (ref 134–144)

## 2016-11-09 LAB — LIPID PANEL
CHOL/HDL RATIO: 3.7 ratio (ref 0.0–4.4)
CHOLESTEROL TOTAL: 180 mg/dL (ref 100–199)
HDL: 49 mg/dL (ref >39)
LDL CALC: 96 mg/dL (ref 0–99)
Triglycerides: 177 mg/dL — ABNORMAL HIGH (ref 0–149)
VLDL CHOLESTEROL CAL: 35 mg/dL (ref 5–40)

## 2016-11-28 ENCOUNTER — Telehealth: Payer: Self-pay | Admitting: Pediatrics

## 2016-11-28 NOTE — Telephone Encounter (Signed)
Please review and advise.

## 2016-11-29 NOTE — Telephone Encounter (Signed)
Pt aware not to take ibuprofen d/t kidneys Instructed her to contact [facility] to see how & who they got the order from to include the ibuprofen and to get this removed. If she is unable to get this removed to contact us if she needs our help

## 2016-11-29 NOTE — Telephone Encounter (Signed)
She should not take ibuprofen regularly because of her kidneys. I am not sure why that was in her pill pack, not on her med list. She should let the pharmacy know to stop that medicine or they are likely to continue to send it.

## 2016-12-30 ENCOUNTER — Other Ambulatory Visit: Payer: Self-pay | Admitting: Pediatrics

## 2016-12-30 DIAGNOSIS — J4530 Mild persistent asthma, uncomplicated: Secondary | ICD-10-CM

## 2017-02-14 ENCOUNTER — Ambulatory Visit: Payer: Medicare Other | Admitting: Pediatrics

## 2017-03-19 ENCOUNTER — Other Ambulatory Visit: Payer: Self-pay | Admitting: Pediatrics

## 2017-03-19 DIAGNOSIS — J4530 Mild persistent asthma, uncomplicated: Secondary | ICD-10-CM

## 2017-03-19 NOTE — Telephone Encounter (Signed)
Next OV 03/22/17

## 2017-03-22 ENCOUNTER — Ambulatory Visit (INDEPENDENT_AMBULATORY_CARE_PROVIDER_SITE_OTHER): Payer: Medicare Other | Admitting: Pediatrics

## 2017-03-22 ENCOUNTER — Telehealth: Payer: Self-pay | Admitting: Pediatrics

## 2017-03-22 ENCOUNTER — Encounter: Payer: Self-pay | Admitting: Pediatrics

## 2017-03-22 VITALS — BP 129/80 | HR 74 | Temp 98.4°F | Ht 64.0 in | Wt 182.0 lb

## 2017-03-22 DIAGNOSIS — M25551 Pain in right hip: Secondary | ICD-10-CM

## 2017-03-22 DIAGNOSIS — M25552 Pain in left hip: Secondary | ICD-10-CM | POA: Diagnosis not present

## 2017-03-22 DIAGNOSIS — J4530 Mild persistent asthma, uncomplicated: Secondary | ICD-10-CM

## 2017-03-22 DIAGNOSIS — M069 Rheumatoid arthritis, unspecified: Secondary | ICD-10-CM | POA: Diagnosis not present

## 2017-03-22 MED ORDER — ALBUTEROL SULFATE HFA 108 (90 BASE) MCG/ACT IN AERS: 2.0000 | INHALATION_SPRAY | Freq: Four times a day (QID) | RESPIRATORY_TRACT | 3 refills | Status: DC | PRN

## 2017-03-22 NOTE — Telephone Encounter (Signed)
Detailed message left for patient that rx sent to [facility]. Called pill-pack and left detailed message to cancel rx for albuterol.

## 2017-03-22 NOTE — Patient Instructions (Signed)
Call Dr. Olevia Perches office to schedule colonoscopy

## 2017-03-22 NOTE — Progress Notes (Signed)
  Subjective:   Patient ID: Anne Strong, female    DOB: May 29, 1946, 71 y.o.   MRN: [id number] CC: Hypertension (pt here today for routine follow up of her chronic medical conditions and is also c/o arthritis in both hips and she has a "cyst" on her right elbow)  HPI: Anne Strong is a 71 y.o. female presenting for Hypertension (pt here today for routine follow up of her chronic medical conditions and is also c/o arthritis in both hips and she has a "cyst" on her right elbow)  Groin pain b/l, worse in the morning and when pt first stands up Aleve helps some Minimizing intake due to CKD Hips have been aching more often  Has h/o RA, last labs from [place] Recently started having hand swelling and pain  Has small cystic area on R lateral elbow   Relevant past medical, surgical, family and social history reviewed. Allergies and medications reviewed and updated. History  Smoking Status  . Former Smoker  . Types: Cigarettes  . Quit date: 08/04/2012  Smokeless Tobacco  . Never Used   ROS: Per HPI   Objective:    BP 129/80   Pulse 74   Temp 98.4 F (36.9 C) (Oral)   Ht 5' 4" (1.626 m)   Wt 182 lb (82.6 kg)   BMI 31.24 kg/m   Wt Readings from Last 3 Encounters:  03/22/17 182 lb (82.6 kg)  11/08/16 184 lb (83.5 kg)  08/16/16 185 lb (83.9 kg)    Gen: NAD, alert, cooperative with exam, NCAT EYES: EOMI, no conjunctival injection, or no icterus ENT:  OP without erythema LYMPH: no cervical LAD CV: NRRR, normal S1/S2, no murmur, distal pulses 2+ b/l Resp: CTABL, no wheezes, normal WOB Abd: +BS, soft, NTND. no guarding or organomegaly Ext: No edema, warm Neuro: Alert and oriented MSK: groin pain b/l with ROM of hips, equal int and external b/l  Assessment & Plan:  Siera was seen today for hypertension.  Diagnoses and all orders for this visit:  Pain of both hip joints Check labs Xray 3 mo ago with degenerative changes in spine, not in hips -     CBC with  Differential/Platelet -     CMP14+EGFR -     Sedimentation rate -     C-reactive protein -     CYCLIC CITRUL PEPTIDE ANTIBODY, IGG/IGA -     Rheumatoid factor -     Ambulatory referral to Orthopedic Surgery  Rheumatoid arthritis, involving unspecified site, unspecified rheumatoid factor presence ([place]) Previous diagnosis Has been asymptomatic off of medication for several years Previously followed in [place] labs as above  Mild persistent asthma without complication Stable, albuterol prn   Follow up plan: Return in about 3 months (around 06/21/2017). Assunta Found, MD [place]

## 2017-03-23 ENCOUNTER — Other Ambulatory Visit: Payer: Self-pay | Admitting: Family

## 2017-03-23 DIAGNOSIS — I700 Atherosclerosis of aorta: Secondary | ICD-10-CM

## 2017-03-23 DIAGNOSIS — J4530 Mild persistent asthma, uncomplicated: Secondary | ICD-10-CM

## 2017-03-23 DIAGNOSIS — I10 Essential (primary) hypertension: Secondary | ICD-10-CM

## 2017-04-25 ENCOUNTER — Ambulatory Visit (INDEPENDENT_AMBULATORY_CARE_PROVIDER_SITE_OTHER): Payer: Medicare Other

## 2017-04-25 ENCOUNTER — Other Ambulatory Visit (INDEPENDENT_AMBULATORY_CARE_PROVIDER_SITE_OTHER): Payer: Self-pay

## 2017-04-25 ENCOUNTER — Encounter (INDEPENDENT_AMBULATORY_CARE_PROVIDER_SITE_OTHER): Payer: Self-pay | Admitting: Orthopaedic Surgery

## 2017-04-25 ENCOUNTER — Ambulatory Visit (INDEPENDENT_AMBULATORY_CARE_PROVIDER_SITE_OTHER): Payer: Medicare Other | Admitting: Orthopaedic Surgery

## 2017-04-25 VITALS — Resp 14 | Ht 62.0 in | Wt 182.0 lb

## 2017-04-25 DIAGNOSIS — M25552 Pain in left hip: Secondary | ICD-10-CM

## 2017-04-25 DIAGNOSIS — M25551 Pain in right hip: Secondary | ICD-10-CM | POA: Diagnosis not present

## 2017-04-25 NOTE — Addendum Note (Signed)
Addended by: Mervyn Skeeters on: 05/29/2017 02:49 PM   Modules accepted: Orders

## 2017-04-25 NOTE — Progress Notes (Signed)
Office Visit Note   Patient: Anne Strong           Date of Birth: 1946/02/13           MRN: [id number] Visit Date: 02-19-2017              Requested by: Eustaquio Maize, MD [place], [place] [address] PCP: Eustaquio Maize, MD   Assessment & Plan: Visit Diagnoses:  1. Pain of both hip joints     Plan: I think the problem originates from her lumbar spine. She also has some widening of the abdominal aorta. I think it's obtaining an MRI scan of the lumbar spine and an ultrasound of her aorta. I discussed this in detail with her and we'll set them up but is see her back shortly thereafter  Follow-Up Instructions: No Follow-up on file.   Orders:  Orders Placed This Encounter  Procedures  . XR Lumbar Spine 2-3 Views   No orders of the defined types were placed in this encounter.     Procedures: No procedures performed   Clinical Data: No additional findings.   Subjective: Chief Complaint  Patient presents with  . Left Hip - Pain    Ms. Jenison is a 71 y o   . Right Hip - Pain  Mrs. Ballow relates insidious onset of bilateral hip and buttock pain some time" this summer". She denies any history of injury or trauma. He actually had films of her pelvis performed to her primary care physician's office in May which I reviewed on the PACS system there is a slight decrease in the joint space the left hip compared to the right but no osteophytes or significant subchondral sclerosis. Considerable degenerative changes a lumbar spine with a degenerative scoliosis to the right with multiple surgical clips from previous surgery. Did not see any evidence of a fracture. She relates that the pain is worse in the morning and when she  stands or walks she'll have some buttock and thigh pain. She's not having pain on  more one side than the other. Change in bowel or bladder function. No numbness or tingling distally. not experiencing any significant back pain. She is not almost localizing  any pain to either groin Again, she specifically relates that she doesn't have any pain to touch but she is stiff and sore when the morning and when she walks and is mostly in the lower part of her back and her buttock.  HPI  Review of Systems  Constitutional: Negative for chills, fatigue and fever.  HENT: Positive for sinus pressure.   Eyes: Negative for itching.  Respiratory: Negative for chest tightness and shortness of breath.   Cardiovascular: Negative for chest pain, palpitations and leg swelling.  Gastrointestinal: Negative for blood in stool, constipation and diarrhea.  Endocrine: Negative for polyuria.  Genitourinary: Negative for dysuria.  Musculoskeletal: Negative for back pain, joint swelling, neck pain and neck stiffness.  Allergic/Immunologic: Negative for immunocompromised state.  Neurological: Positive for light-headedness. Negative for dizziness and numbness.  Hematological: Does not bruise/bleed easily.  Psychiatric/Behavioral: The patient is not nervous/anxious.      Objective: Vital Signs: Resp 14   Ht 5\' 2"  (1.575 m)   Wt 182 lb (82.6 kg)   BMI 33.29 kg/m   Physical Exam  Ortho Exam awake alert and oriented 3. Comfortable sitting. Walks without limp. No pain with range of motion of either hip with internal/external rotation. No percussible back tenderness. Straight leg  raise negative. No pain of the sacroiliac joints. No buttock discomfort. However, when she stands she'll experience some pain in the inferior buttock. This no pain over either [place] or greater trochanter as well as swelling distally. Neurovascular exam intact   Imaging: No results found.   PMFS History: Patient Active Problem List   Diagnosis Date Noted  . Trochanteric bursitis of both hips 08/17/2016  . Aortic atherosclerosis ([place]) 08/23/2015  . Vitamin B 12 deficiency 08/05/2015  . Rheumatoid arthritis ([place]) 08/05/2015  . Asthma, mild persistent 08/05/2015  . Essential  hypertension 08/05/2015  . Cataract 08/05/2015   Past Medical History:  Diagnosis Date  . Anemia   . Anxiety   . Arthritis   . Asthma   . Diabetes mellitus without complication ([place])   . Hyperlipidemia   . Hypertension     No family history on file.  Past Surgical History:  Procedure Laterality Date  . ABDOMINAL HYSTERECTOMY    . APPENDECTOMY    . BUNIONECTOMY    . GANGLION CYST EXCISION     Social History   Occupational History  . Not on file.   Social History Main Topics  . Smoking status: Former Smoker    Types: Cigarettes    Quit date: 08/04/2012  . Smokeless tobacco: Never Used  . Alcohol use No  . Drug use: No  . Sexual activity: Not on file

## 2017-04-27 ENCOUNTER — Telehealth (INDEPENDENT_AMBULATORY_CARE_PROVIDER_SITE_OTHER): Payer: Self-pay | Admitting: *Deleted

## 2017-04-27 NOTE — Telephone Encounter (Signed)
Pt has appt scheduled at [facility] for MRI on Fri Oct 26 at 2p, pt is to arrive 15  mins early to register, lmtrc to pt for appt informaiton

## 2017-05-04 ENCOUNTER — Ambulatory Visit (HOSPITAL_COMMUNITY)
Admission: RE | Admit: 2017-05-04 | Discharge: 2017-05-04 | Disposition: A | Discharge status: Home / Self Care | Payer: Medicare Other | Source: Ambulatory Visit | Attending: Orthopaedic Surgery | Admitting: Orthopaedic Surgery

## 2017-05-04 DIAGNOSIS — M25551 Pain in right hip: Secondary | ICD-10-CM | POA: Diagnosis present

## 2017-05-04 DIAGNOSIS — I714 Abdominal aortic aneurysm, without rupture: Secondary | ICD-10-CM | POA: Insufficient documentation

## 2017-05-04 DIAGNOSIS — M25552 Pain in left hip: Secondary | ICD-10-CM | POA: Insufficient documentation

## 2017-05-09 ENCOUNTER — Ambulatory Visit (INDEPENDENT_AMBULATORY_CARE_PROVIDER_SITE_OTHER): Payer: Medicare Other | Admitting: Orthopaedic Surgery

## 2017-05-09 ENCOUNTER — Encounter (INDEPENDENT_AMBULATORY_CARE_PROVIDER_SITE_OTHER): Payer: Self-pay | Admitting: Orthopaedic Surgery

## 2017-05-09 VITALS — Resp 14 | Ht 63.0 in | Wt 175.0 lb

## 2017-05-09 DIAGNOSIS — M5410 Radiculopathy, site unspecified: Secondary | ICD-10-CM

## 2017-05-09 DIAGNOSIS — M5440 Lumbago with sciatica, unspecified side: Secondary | ICD-10-CM

## 2017-05-09 NOTE — Progress Notes (Signed)
Office Visit Note   Patient: Anne Strong           Date of Birth: 04-08-1946           MRN: [id number] Visit Date: 05/09/2017              Requested by: Eustaquio Maize, MD [place], [place] [id number] PCP: Eustaquio Maize, MD   Assessment & Plan: Visit Diagnoses:  1. Low back pain with sciatica, sciatica laterality unspecified, unspecified back pain laterality, unspecified chronicity   2. Radicular leg pain     Plan: MRI scan of the lumbar spine demonstrated severe central canal and bilateral subarticular recess narrowing at L3-4. There is moderate to moderately severe foraminal narrowing worse on the left. She is actually having some more pain on the right. There was moderate to moderately severe central canal and bilateral foraminal narrowing at L4-5. He was moderate to moderately severe bilateral foraminal narrowing at L5-S1 worse on the right. There was also a small descending abdominal aortic aneurysm measuring 3.4 cm in diameter. Discussion regarding the above. I would recommend a vascular surgery consult to be followed intervals for the aneurysm. I would also suggest having Dr. Ernestina Patches evaluate for an epidural steroid injection. She like to proceed so we'll set up those appointments  Follow-Up Instructions: Return if symptoms worsen or fail to improve.   Orders:  Orders Placed This Encounter  Procedures  . Ambulatory referral to Vascular Surgery  . Ambulatory referral to Physical Medicine Rehab   No orders of the defined types were placed in this encounter.     Procedures: No procedures performed   Clinical Data: No additional findings.   Subjective: Chief Complaint  Patient presents with  . Lower Back - Pain, Results  Per prior office notes having low back pains longer she stands or walks associated with buttock discomfort along the more trouble on the right than the left lower extremity with weightbearing and ambulation my scan performed with the  results as above  HPI  Review of Systems  Constitutional: Negative for chills, fatigue and fever.  HENT: Positive for sinus pressure.   Eyes: Negative for itching.  Respiratory: Negative for chest tightness and shortness of breath.   Cardiovascular: Positive for leg swelling. Negative for chest pain and palpitations.  Gastrointestinal: Negative for blood in stool, constipation and diarrhea.  Endocrine: Negative for polyuria.  Genitourinary: Negative for dysuria.  Musculoskeletal: Positive for arthralgias. Negative for back pain, joint swelling, neck pain and neck stiffness.  Allergic/Immunologic: Negative for immunocompromised state.  Neurological: Positive for weakness and numbness. Negative for dizziness.  Hematological: Does not bruise/bleed easily.  Psychiatric/Behavioral: The patient is not nervous/anxious.      Objective: Vital Signs: Resp 14   Ht 5\' 3"  (1.6 m)   Wt 175 lb (79.4 kg)   BMI 31.00 kg/m   Physical Exam  Ortho Exam awake alert and oriented 3, sitting no shortness of breath or chest pain. Walks without a limp. No percussible tenderness of the lumbar spine. Straight leg raise negative. Painless range of motion of both hips today. No thigh or calf pain. Good pulses. Symptoms are predominantly subjective as noted by the MRI scan results as above  Specialty Comments:  No specialty comments available.  Imaging: No results found.   PMFS History: Patient Active Problem List   Diagnosis Date Noted  . Trochanteric bursitis of both hips 08/17/2016  . Aortic atherosclerosis ([place]) 08/23/2015  . Vitamin B  12 deficiency 08/05/2015  . Rheumatoid arthritis ([place]) 08/05/2015  . Asthma, mild persistent 08/05/2015  . Essential hypertension 08/05/2015  . Cataract 08/05/2015   Past Medical History:  Diagnosis Date  . Anemia   . Anxiety   . Arthritis   . Asthma   . Diabetes mellitus without complication ([place])   . Hyperlipidemia   . Hypertension     History  reviewed. No pertinent family history.  Past Surgical History:  Procedure Laterality Date  . ABDOMINAL HYSTERECTOMY    . APPENDECTOMY    . BUNIONECTOMY    . GANGLION CYST EXCISION     Social History   Occupational History  . Not on file.   Social History Main Topics  . Smoking status: Former Smoker    Types: Cigarettes    Quit date: 08/04/2012  . Smokeless tobacco: Never Used  . Alcohol use No  . Drug use: No  . Sexual activity: Not on file

## 2017-05-14 ENCOUNTER — Ambulatory Visit: Payer: Medicare Other | Admitting: Pediatrics

## 2017-05-21 ENCOUNTER — Ambulatory Visit (INDEPENDENT_AMBULATORY_CARE_PROVIDER_SITE_OTHER): Payer: Medicare Other | Admitting: Physical Medicine and Rehabilitation

## 2017-05-21 ENCOUNTER — Encounter (INDEPENDENT_AMBULATORY_CARE_PROVIDER_SITE_OTHER): Payer: Self-pay | Admitting: Physical Medicine and Rehabilitation

## 2017-05-21 ENCOUNTER — Ambulatory Visit (INDEPENDENT_AMBULATORY_CARE_PROVIDER_SITE_OTHER): Payer: Self-pay

## 2017-05-21 VITALS — BP 131/80 | HR 65 | Ht 64.0 in | Wt 182.0 lb

## 2017-05-21 DIAGNOSIS — M4316 Spondylolisthesis, lumbar region: Secondary | ICD-10-CM

## 2017-05-21 DIAGNOSIS — M48062 Spinal stenosis, lumbar region with neurogenic claudication: Secondary | ICD-10-CM

## 2017-05-21 DIAGNOSIS — M5416 Radiculopathy, lumbar region: Secondary | ICD-10-CM

## 2017-05-21 MED ORDER — LIDOCAINE HCL (PF) 1 % IJ SOLN
2.0000 mL | Freq: Once | INTRAMUSCULAR | Status: AC
  Administered 2017-05-21: 2 mL

## 2017-05-21 MED ORDER — BETAMETHASONE SOD PHOS & ACET 6 (3-3) MG/ML IJ SUSP
12.0000 mg | Freq: Once | INTRAMUSCULAR | Status: AC
  Administered 2017-05-21: 12 mg

## 2017-05-21 NOTE — Patient Instructions (Signed)

## 2017-05-21 NOTE — Progress Notes (Deleted)
Patient presents with low back pain that radiates into buttocks and both hips. She states that the pain is equal on left and right sides. The pain is worse in the morning making it difficult to get around. She takes aleve for pain when needed but was advised not to take too many by her PCP.

## 2017-05-23 NOTE — Procedures (Signed)
Anne Strong is a very pleasant 71 year old female that comes in today at the request of Dr. Durward Fortes for epidural steroid injection.  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  She has significant osteoarthritis.  She has moderate to severe stenosis at L4-5.  She gets claudication type symptoms into the upper thighs more than L3 and 4 distribution.  She has failed conservative care including medication management.  She does use Aleve that she supposed to use sparingly.  She has had no prior lumbar surgeries.  She has been managed effectively by Dr. Durward Fortes but without relief.  We will complete diagnostic and hopefully therapeutic bilateral L3 transforaminal epidural steroid injection.  The patient's case is complicated by rheumatoid arthritis.  She likely would do well with decompression surgery but with the listhesis they manage look at fusion but hopefully we can keep her comfortable.  Lumbosacral Transforaminal Epidural Steroid Injection - Sub-Pedicular Approach with Fluoroscopic Guidance  Patient: Anne Strong      Date of Birth: March 12, 1946 MRN: [id number] PCP: Eustaquio Maize, MD      Visit Date: 05/21/2017   Universal Protocol:    Date/Time: 05/21/2017  Consent Given By: the patient  Position: PRONE  Additional Comments: Vital signs were monitored before and after the procedure. Patient was prepped and draped in the usual sterile fashion. The correct patient, procedure, and site was verified.   Injection Procedure Details:  Procedure Site One Meds Administered:  Meds ordered this encounter  Medications  . lidocaine (PF) (XYLOCAINE) 1 % injection 2 mL  . betamethasone acetate-betamethasone sodium phosphate (CELESTONE) injection 12 mg    Laterality: Bilateral  Location/Site:  L3-L4  Needle size: 22 G  Needle type: Spinal  Needle Placement: Transforaminal  Findings:  -Contrast Used: 1 mL iohexol 180  mg iodine/mL   -Comments: Excellent flow of contrast along the nerve and into the epidural space.  Procedure Details: After squaring off the end-plates to get a true AP view, the C-arm was positioned so that an oblique view of the foramen as noted above was visualized. The target area is just inferior to the "nose of the scotty dog" or sub pedicular. The soft tissues overlying this structure were infiltrated with 2-3 ml. of 1% Lidocaine without Epinephrine.  The spinal needle was inserted toward the target using a "trajectory" view along the fluoroscope beam.  Under AP and lateral visualization, the needle was advanced so it did not puncture dura and was located close the 6 O'Clock position of the pedical in AP tracterory. Biplanar projections were used to confirm position. Aspiration was confirmed to be negative for CSF and/or blood. A 1-2 ml. volume of Isovue-250 was injected and flow of contrast was noted at each level. Radiographs were obtained for documentation purposes.   After attaining the desired flow of contrast documented above, a 0.5 to 1.0 ml test dose of 0.25% Marcaine was injected into each respective transforaminal space.  The patient was observed for 90 seconds post injection.  After no sensory deficits were reported, and normal lower extremity motor function was noted,   the above injectate was administered so that equal amounts of the injectate were placed at each foramen (level) into the transforaminal epidural space.   Additional Comments:  The patient tolerated the procedure well No complications occurred Dressing: Band-Aid    Post-procedure details: Patient was observed during the procedure. Post-procedure instructions were reviewed.  Patient left  the clinic in stable condition.

## 2017-05-29 ENCOUNTER — Telehealth (INDEPENDENT_AMBULATORY_CARE_PROVIDER_SITE_OTHER): Payer: Self-pay | Admitting: Physical Medicine and Rehabilitation

## 2017-05-30 NOTE — Telephone Encounter (Signed)
Ok to repeat if some help

## 2017-06-04 NOTE — Telephone Encounter (Signed)
Left message for patient to call back to schedule/ discuss. 

## 2017-06-07 NOTE — Telephone Encounter (Signed)
Scheduled for 06/20/17 at 1530 with driver and no blood thinners.

## 2017-06-11 ENCOUNTER — Telehealth: Payer: Self-pay | Admitting: Pediatrics

## 2017-06-11 NOTE — Telephone Encounter (Signed)
Pt is wanting to change Norvasc 5 mg because she read on google it was recalled. Please call in something else To [place]. Please advise.

## 2017-06-11 NOTE — Telephone Encounter (Signed)
I have not heard about any recalls of amlodipine. She can call the dispensing pharmacy, they would know if the medication lot she was sent was affected by a recall.

## 2017-06-11 NOTE — Telephone Encounter (Signed)
Patient aware of reccomendation

## 2017-06-13 ENCOUNTER — Ambulatory Visit: Payer: Medicare Other | Admitting: Vascular Surgery

## 2017-06-13 ENCOUNTER — Encounter: Payer: Self-pay | Admitting: Vascular Surgery

## 2017-06-13 VITALS — BP 125/91 | HR 82 | Temp 98.0°F | Resp 18 | Ht 64.0 in | Wt 179.0 lb

## 2017-06-13 DIAGNOSIS — I714 Abdominal aortic aneurysm, without rupture, unspecified: Secondary | ICD-10-CM

## 2017-06-13 NOTE — Progress Notes (Signed)
Patient ID: [name], female   DOB: 07/17/1945, 71 y.o.   MRN: [id number]  Reason for Consult: AAA (3.4 cm AAA, Dr Durward Fortes)   Referred by Anne Maize, MD  Subjective:     HPI:  Anne Strong is a 71 y.o. female with family history of abdominal aortic aneurysm in her mother who ultimately died of cancer.  She herself has significant arthritis as well as back pain and recently underwent MRI which demonstrated 3.4 cm aneurysm.  She does not have any limitation to her walking other than her back pain.  She has no new back or abdominal pain she eats well without any limitation.  She is active.  She is a former smoker which are her risk factors as well as her family history for having an aneurysm.  Past Medical History:  Diagnosis Date  . AAA (abdominal aortic aneurysm) ([place])   . Anemia   . Anxiety   . Arthritis   . Asthma   . Diabetes mellitus without complication ([place])   . Hyperlipidemia   . Hypertension    Family History  Problem Relation Age of Onset  . AAA (abdominal aortic aneurysm) Mother   . Heart disease Mother    Past Surgical History:  Procedure Laterality Date  . ABDOMINAL HYSTERECTOMY    . APPENDECTOMY    . BUNIONECTOMY    . GANGLION CYST EXCISION      Short Social History:  Social History   Tobacco Use  . Smoking status: Former Smoker    Types: Cigarettes    Last attempt to quit: 08/04/2012    Years since quitting: 4.8  . Smokeless tobacco: Never Used  Substance Use Topics  . Alcohol use: No    Allergies  Allergen Reactions  . Penicillins Anaphylaxis  . Codeine Itching    Current Outpatient Medications  Medication Sig Dispense Refill  . albuterol (PROVENTIL HFA;VENTOLIN HFA) 108 (90 Base) MCG/ACT inhaler Inhale 2 puffs into the lungs every 6 (six) hours as needed for wheezing or shortness of breath. 1 Inhaler 3  . amLODipine (NORVASC) 5 MG tablet Take 1 tablet (5 mg total) by mouth daily. 90 tablet 1  . amLODipine (NORVASC) 5 MG tablet  TAKE ONE TABLET BY MOUTH ONCE DAILY (Patient not taking: Reported on 05/21/2017) 90 tablet 1  . aspirin 81 MG tablet Take 81 mg by mouth daily.    . calcium-vitamin D (OSCAL WITH D) 500-200 MG-UNIT tablet Take 1 tablet by mouth.    . metoprolol succinate (TOPROL-XL) 25 MG 24 hr tablet Take 1 tablet (25 mg total) by mouth daily. 90 tablet 1  . montelukast (SINGULAIR) 10 MG tablet Take 1 tablet (10 mg total) by mouth at bedtime. 90 tablet 0  . montelukast (SINGULAIR) 10 MG tablet TAKE ONE TABLET BY MOUTH ONCE DAILY AT BEDTIME (Patient not taking: Reported on 01-16-202018) 90 tablet 1  . naproxen sodium (ALEVE) 220 MG tablet Take 220 mg by mouth daily as needed.    . pravastatin (PRAVACHOL) 40 MG tablet Take 1 tablet (40 mg total) by mouth daily. 90 tablet 1  . pravastatin (PRAVACHOL) 40 MG tablet TAKE ONE TABLET BY MOUTH ONCE DAILY (Patient not taking: Reported on 05/21/2017) 90 tablet 0  . ramipril (ALTACE) 10 MG capsule Take 1 capsule (10 mg total) by mouth daily. 90 capsule 1  . vitamin B-12 (CYANOCOBALAMIN) 100 MCG tablet Take 100 mcg by mouth daily.    . vitamin C (ASCORBIC ACID) 250 MG  tablet Take 250 mg by mouth daily.     No current facility-administered medications for this visit.     Review of Systems  Constitutional:  Constitutional negative. HENT: HENT negative.  Eyes: Eyes negative.  Respiratory: Positive for wheezing.  GI: Gastrointestinal negative.  Musculoskeletal: Positive for leg pain.  Neurological: Positive for numbness.  Hematologic: Hematologic/lymphatic negative.  Psychiatric: Psychiatric negative.        Objective:  Objective   Vitals:   06/13/17 1205  BP: (!) 125/91  Pulse: 82  Resp: 18  Temp: 98 F (36.7 C)  TempSrc: Oral  SpO2: 100%  Weight: 179 lb (81.2 kg)  Height: 5\' 4"  (1.626 m)   Body mass index is 30.73 kg/m.  Physical Exam  Constitutional: She is oriented to person, place, and time. She appears well-developed.  HENT:  Head:  Normocephalic.  Neck: Normal range of motion.  Cardiovascular: Normal rate.  Pulses:      Radial pulses are 2+ on the right side, and 2+ on the left side.       Femoral pulses are 2+ on the right side, and 2+ on the left side. Pulmonary/Chest: Effort normal.  Abdominal: Soft. She exhibits no mass.  Musculoskeletal: Normal range of motion. She exhibits no edema.  Neurological: She is oriented to person, place, and time.  Skin: Skin is warm and dry.  Psychiatric: She has a normal mood and affect. Her behavior is normal. Judgment and thought content normal.    Data: IMPRESSION: Severe central canal and bilateral subarticular recess narrowing at L3-4. Moderate to moderately severe foraminal narrowing at this level is worse on the left.  Moderate to moderately severe central canal and bilateral foraminal narrowing at L4-5.  Moderate to moderately severe bilateral foraminal narrowing at L5-S1 appears worse on the right.  Small descending abdominal aortic aneurysm measures 3.4 cm in diameter. Recommend followup by ultrasound in 3 years. This recommendation follows ACR consensus guidelines: White Paper of the ACR Incidental Findings Committee II on Vascular Findings. Joellyn Rued Radiol 2013; 10:789-794      Assessment/Plan:     71 year old female sent for evaluation 3.4 cm aneurysm.  This was found incidentally but she does have a family history.  Given that there is only one study demonstrating this will have her follow-up in 1 year with ultrasound at which time if it is stable she will be in the range to go out to 2 years.  Should she have new back or abdominal pain in the interim we can certainly see her sooner.  She demonstrates good understanding.     Waynetta Sandy MD [facility]

## 2017-06-17 ENCOUNTER — Other Ambulatory Visit: Payer: Self-pay | Admitting: Pediatrics

## 2017-06-17 DIAGNOSIS — I700 Atherosclerosis of aorta: Secondary | ICD-10-CM

## 2017-06-17 DIAGNOSIS — I10 Essential (primary) hypertension: Secondary | ICD-10-CM

## 2017-06-17 DIAGNOSIS — J4530 Mild persistent asthma, uncomplicated: Secondary | ICD-10-CM

## 2017-06-20 ENCOUNTER — Encounter (INDEPENDENT_AMBULATORY_CARE_PROVIDER_SITE_OTHER): Payer: Medicare Other | Admitting: Physical Medicine and Rehabilitation

## 2017-06-22 ENCOUNTER — Ambulatory Visit: Payer: Medicare Other | Admitting: Pediatrics

## 2017-06-26 ENCOUNTER — Ambulatory Visit: Payer: Medicare Other | Admitting: Pediatrics

## 2017-07-02 NOTE — Addendum Note (Signed)
Addended by: Lianne Cure A on: 07/02/2017 11:22 AM   Modules accepted: Orders

## 2017-08-02 ENCOUNTER — Encounter (INDEPENDENT_AMBULATORY_CARE_PROVIDER_SITE_OTHER): Payer: Medicare Other | Admitting: Physical Medicine and Rehabilitation

## 2017-08-13 ENCOUNTER — Ambulatory Visit (INDEPENDENT_AMBULATORY_CARE_PROVIDER_SITE_OTHER): Payer: Medicare Other | Admitting: Pediatrics

## 2017-08-13 ENCOUNTER — Encounter: Payer: Self-pay | Admitting: Pediatrics

## 2017-08-13 VITALS — Temp 98.2°F | Ht 64.0 in | Wt 180.6 lb

## 2017-08-13 DIAGNOSIS — J4530 Mild persistent asthma, uncomplicated: Secondary | ICD-10-CM | POA: Diagnosis not present

## 2017-08-13 DIAGNOSIS — K5900 Constipation, unspecified: Secondary | ICD-10-CM | POA: Diagnosis not present

## 2017-08-13 DIAGNOSIS — Z1211 Encounter for screening for malignant neoplasm of colon: Secondary | ICD-10-CM

## 2017-08-13 DIAGNOSIS — I10 Essential (primary) hypertension: Secondary | ICD-10-CM

## 2017-08-13 DIAGNOSIS — D649 Anemia, unspecified: Secondary | ICD-10-CM | POA: Diagnosis not present

## 2017-08-13 MED ORDER — ALBUTEROL SULFATE HFA 108 (90 BASE) MCG/ACT IN AERS: 2.0000 | INHALATION_SPRAY | Freq: Four times a day (QID) | RESPIRATORY_TRACT | 2 refills | Status: DC | PRN

## 2017-08-13 MED ORDER — BENZONATATE 100 MG PO CAPS: 100.0000 mg | ORAL_CAPSULE | Freq: Two times a day (BID) | ORAL | 0 refills | Status: DC | PRN

## 2017-08-13 MED ORDER — LINACLOTIDE 72 MCG PO CAPS: 72.0000 ug | ORAL_CAPSULE | Freq: Every day | ORAL | 2 refills | Status: DC

## 2017-08-13 MED ORDER — BUDESONIDE-FORMOTEROL FUMARATE 80-4.5 MCG/ACT IN AERO: 2.0000 | INHALATION_SPRAY | Freq: Two times a day (BID) | RESPIRATORY_TRACT | 4 refills | Status: DC

## 2017-08-13 NOTE — Progress Notes (Signed)
  Subjective:   Patient ID: [name], female    DOB: 1946-05-11, 72 y.o.   MRN: [id number] CC: Follow-up (3 month)  HPI: JORITA BOHANON is a 72 y.o. female presenting for Follow-up (3 month)  Asthma: had asthma exacerbation in [place] twice Was treated with prednisone, albuterol, budesonide Breathing much better now Everytime she goes back to [place] she says she gets asthma exacerbations Started with URI symptoms  HTN: taking medicine regularly No CP, no SOB  Continues to have problems with constipation Tried miralax, drinking lots of water, eating a lot of fiber Not going daily, sometimes several days between stools  Relevant past medical, surgical, family and social history reviewed. Allergies and medications reviewed and updated. Social History   Tobacco Use  Smoking Status Former Smoker  . Types: Cigarettes  . Last attempt to quit: 08/04/2012  . Years since quitting: 5.0  Smokeless Tobacco Never Used   ROS: Per HPI   Objective:    Temp 98.2 F (36.8 C) (Oral)   Ht '5\' 4"'$  (1.626 m)   Wt 180 lb 9.6 oz (81.9 kg)   BMI 31.00 kg/m   Wt Readings from Last 3 Encounters:  08/13/17 180 lb 9.6 oz (81.9 kg)  06/13/17 179 lb (81.2 kg)  05/21/17 182 lb (82.6 kg)    Gen: NAD, alert, cooperative with exam, NCAT EYES: EOMI, no conjunctival injection, or no icterus ENT:  OP without erythema LYMPH: no cervical LAD CV: NRRR, normal S1/S2, no murmur, distal pulses 2+ b/l Resp: CTABL, no wheezes, normal WOB Abd: +BS, soft, NTND. no guarding or organomegaly Ext: No edema, warm Neuro: Alert and oriented, strength equal b/l UE and LE, coordination grossly normal MSK: normal muscle bulk  Assessment & Plan:  Shirlee was seen today for follow-up.  Diagnoses and all orders for this visit:  Essential hypertension Well controlled, cont current meds -     BMP8+EGFR  Mild persistent asthma without complication Multiple exacerbations recently, start symbicort If needing albuterol regularly  let me know -     budesonide-formoterol (SYMBICORT) 80-4.5 MCG/ACT inhaler; Inhale 2 puffs into the lungs 2 (two) times daily. -     albuterol (PROVENTIL HFA;VENTOLIN HFA) 108 (90 Base) MCG/ACT inhaler; Inhale 2 puffs into the lungs every 6 (six) hours as needed for wheezing or shortness of breath.  Colon cancer screening -     Ambulatory referral to Gastroenterology  Constipation, unspecified constipation type Trial of below -     linaclotide (LINZESS) 72 MCG capsule; Take 1 capsule (72 mcg total) by mouth daily before breakfast.  Anemia, unspecified type -     CBC with Differential/Platelet  Follow up plan: 3 mo Assunta Found, MD [place]

## 2017-08-14 LAB — BMP8+EGFR
BUN/Creatinine Ratio: 14 (ref 12–28)
BUN: 14 mg/dL (ref 8–27)
CALCIUM: 9.2 mg/dL (ref 8.7–10.3)
CO2: 24 mmol/L (ref 20–29)
Chloride: 102 mmol/L (ref 96–106)
Creatinine, Ser: 1.01 mg/dL — ABNORMAL HIGH (ref 0.57–1.00)
GFR calc Af Amer: 66 mL/min/{1.73_m2} (ref >59)
GFR calc non Af Amer: 57 mL/min/{1.73_m2} — ABNORMAL LOW (ref >59)
Glucose: 115 mg/dL — ABNORMAL HIGH (ref 65–99)
POTASSIUM: 3.9 mmol/L (ref 3.5–5.2)
SODIUM: 140 mmol/L (ref 134–144)

## 2017-08-14 LAB — CBC WITH DIFFERENTIAL/PLATELET
Basophils Absolute: 0 10*3/uL (ref 0.0–0.2)
Basos: 0 %
EOS (ABSOLUTE): 0.2 10*3/uL (ref 0.0–0.4)
Eos: 3 %
Hematocrit: 34 % (ref 34.0–46.6)
Hemoglobin: 10.9 g/dL — ABNORMAL LOW (ref 11.1–15.9)
IMMATURE GRANS (ABS): 0 10*3/uL (ref 0.0–0.1)
IMMATURE GRANULOCYTES: 0 %
LYMPHS: 17 %
Lymphocytes Absolute: 1 10*3/uL (ref 0.7–3.1)
MCH: 28.7 pg (ref 26.6–33.0)
MCHC: 32.1 g/dL (ref 31.5–35.7)
MCV: 90 fL (ref 79–97)
MONOS ABS: 0.5 10*3/uL (ref 0.1–0.9)
Monocytes: 10 %
NEUTROS PCT: 70 %
Neutrophils Absolute: 4 10*3/uL (ref 1.4–7.0)
PLATELETS: 217 10*3/uL (ref 150–379)
RBC: 3.8 x10E6/uL (ref 3.77–5.28)
RDW: 14.4 % (ref 12.3–15.4)
WBC: 5.7 10*3/uL (ref 3.4–10.8)

## 2017-08-15 ENCOUNTER — Ambulatory Visit (INDEPENDENT_AMBULATORY_CARE_PROVIDER_SITE_OTHER): Payer: Medicare Other

## 2017-08-15 ENCOUNTER — Encounter (INDEPENDENT_AMBULATORY_CARE_PROVIDER_SITE_OTHER): Payer: Self-pay | Admitting: Physical Medicine and Rehabilitation

## 2017-08-15 ENCOUNTER — Ambulatory Visit (INDEPENDENT_AMBULATORY_CARE_PROVIDER_SITE_OTHER): Payer: Medicare Other | Admitting: Physical Medicine and Rehabilitation

## 2017-08-15 VITALS — BP 113/78 | HR 80 | Temp 98.7°F

## 2017-08-15 DIAGNOSIS — M5416 Radiculopathy, lumbar region: Secondary | ICD-10-CM | POA: Diagnosis not present

## 2017-08-15 DIAGNOSIS — M48062 Spinal stenosis, lumbar region with neurogenic claudication: Secondary | ICD-10-CM

## 2017-08-15 MED ORDER — BETAMETHASONE SOD PHOS & ACET 6 (3-3) MG/ML IJ SUSP
12.0000 mg | Freq: Once | INTRAMUSCULAR | Status: AC
  Administered 2017-08-15: 12 mg

## 2017-08-15 NOTE — Progress Notes (Deleted)
Pt states aching pain in both left and right hip no pain in groin area. Pt states some numbness in both right and left thigh. Pt states pain has been going 3-4 months. Pt states cold weather makes the pain worse, aleve makes pain better. Pt states last injection helped for a little while. +Driver, -BT, -Dye Allergies.

## 2017-08-15 NOTE — Patient Instructions (Signed)

## 2017-09-03 ENCOUNTER — Encounter: Payer: Self-pay | Admitting: Pediatrics

## 2017-09-03 DIAGNOSIS — I714 Abdominal aortic aneurysm, without rupture, unspecified: Secondary | ICD-10-CM | POA: Insufficient documentation

## 2017-09-03 NOTE — Procedures (Signed)
Lumbosacral Transforaminal Epidural Steroid Injection - Sub-Pedicular Approach with Fluoroscopic Guidance  Patient: Anne Strong      Date of Birth: 06/09/1946 MRN: [id number] PCP: Eustaquio Maize, MD      Visit Date: 08/15/2017   Universal Protocol:    Date/Time: 08/15/2017  Consent Given By: the patient  Position: PRONE  Additional Comments: Vital signs were monitored before and after the procedure. Patient was prepped and draped in the usual sterile fashion. The correct patient, procedure, and site was verified.   Injection Procedure Details:  Procedure Site One Meds Administered:  Meds ordered this encounter  Medications  . betamethasone acetate-betamethasone sodium phosphate (CELESTONE) injection 12 mg    Laterality: Bilateral  Location/Site:  L3-L4  Needle size: 22 G  Needle type: Spinal  Needle Placement: Transforaminal  Findings:    -Comments: Excellent flow of contrast along the nerve and into the epidural space.  Procedure Details: After squaring off the end-plates to get a true AP view, the C-arm was positioned so that an oblique view of the foramen as noted above was visualized. The target area is just inferior to the "nose of the scotty dog" or sub pedicular. The soft tissues overlying this structure were infiltrated with 2-3 ml. of 1% Lidocaine without Epinephrine.  The spinal needle was inserted toward the target using a "trajectory" view along the fluoroscope beam.  Under AP and lateral visualization, the needle was advanced so it did not puncture dura and was located close the 6 O'Clock position of the pedical in AP tracterory. Biplanar projections were used to confirm position. Aspiration was confirmed to be negative for CSF and/or blood. A 1-2 ml. volume of Isovue-250 was injected and flow of contrast was noted at each level. Radiographs were obtained for documentation purposes.   After attaining the desired flow of contrast documented above, a 0.5  to 1.0 ml test dose of 0.25% Marcaine was injected into each respective transforaminal space.  The patient was observed for 90 seconds post injection.  After no sensory deficits were reported, and normal lower extremity motor function was noted,   the above injectate was administered so that equal amounts of the injectate were placed at each foramen (level) into the transforaminal epidural space.   Additional Comments:  The patient tolerated the procedure well Dressing: Band-Aid    Post-procedure details: Patient was observed during the procedure. Post-procedure instructions were reviewed.  Patient left the clinic in stable condition.

## 2017-09-03 NOTE — Progress Notes (Signed)
Anne Strong - 72 y.o. female MRN [id number]  Date of birth: 07-08-1946  Office Visit Note: Visit Date: 08/15/2017 PCP: Eustaquio Maize, MD Referred by: Eustaquio Maize, MD  Subjective: Chief Complaint  Patient presents with  . Right Hip - Pain  . Left Hip - Pain  . Right Thigh - Numbness  . Left Thigh - Numbness   HPI: Anne Strong is a 72 year old female with bilateral low back and hip pain.  She did get relief for the last injection which was a bilateral L3 transforaminal epidural steroid injection.  MRI is reviewed again below.  We are going to repeat the injection today.    ROS Otherwise per HPI.  Assessment & Plan: Visit Diagnoses:  1. Lumbar radiculopathy   2. Spinal stenosis of lumbar region with neurogenic claudication     Plan: No additional findings.   Meds & Orders:  Meds ordered this encounter  Medications  . betamethasone acetate-betamethasone sodium phosphate (CELESTONE) injection 12 mg    Orders Placed This Encounter  Procedures  . XR C-ARM NO REPORT  . Epidural Steroid injection    Follow-up: Return if symptoms worsen or fail to improve.   Procedures: No procedures performed  Lumbosacral Transforaminal Epidural Steroid Injection - Sub-Pedicular Approach with Fluoroscopic Guidance  Anne Strong      Date of Birth: 1945/11/01 MRN: [id number] PCP: Eustaquio Maize, MD      Visit Date: 08/15/2017   Universal Protocol:    Date/Time: 08/15/2017  Consent Given By: the patient  Position: PRONE  Additional Comments: Vital signs were monitored before and after the procedure. Patient was prepped and draped in the usual sterile fashion. The correct patient, procedure, and site was verified.   Injection Procedure Details:  Procedure Site One Meds Administered:  Meds ordered this encounter  Medications  . betamethasone acetate-betamethasone sodium phosphate (CELESTONE) injection 12 mg    Laterality: Bilateral  Location/Site:   L3-L4  Needle size: 22 G  Needle type: Spinal  Needle Placement: Transforaminal  Findings:    -Comments: Excellent flow of contrast along the nerve and into the epidural space.  Procedure Details: After squaring off the end-plates to get a true AP view, the C-arm was positioned so that an oblique view of the foramen as noted above was visualized. The target area is just inferior to the "nose of the scotty dog" or sub pedicular. The soft tissues overlying this structure were infiltrated with 2-3 ml. of 1% Lidocaine without Epinephrine.  The spinal needle was inserted toward the target using a "trajectory" view along the fluoroscope beam.  Under AP and lateral visualization, the needle was advanced so it did not puncture dura and was located close the 6 O'Clock position of the pedical in AP tracterory. Biplanar projections were used to confirm position. Aspiration was confirmed to be negative for CSF and/or blood. A 1-2 ml. volume of Isovue-250 was injected and flow of contrast was noted at each level. Radiographs were obtained for documentation purposes.   After attaining the desired flow of contrast documented above, a 0.5 to 1.0 ml test dose of 0.25% Marcaine was injected into each respective transforaminal space.  The patient was observed for 90 seconds post injection.  After no sensory deficits were reported, and normal lower extremity motor function was noted,   the above injectate was administered so that equal amounts of the injectate were placed at each foramen (level) into the transforaminal epidural space.   Additional Comments:  The patient tolerated the procedure well Dressing: Band-Aid    Post-procedure details: Patient was observed during the procedure. Post-procedure instructions were reviewed.  Patient left the clinic in stable condition.    Clinical History: MRI LUMBAR SPINE WITHOUT CONTRAST  TECHNIQUE: Multiplanar, multisequence MR imaging of the lumbar spine  was performed. No intravenous contrast was administered.  COMPARISON:  None.  FINDINGS: Segmentation:  Standard.  Alignment: Facet arthropathy results in 0.3 cm anterolisthesis L3 on L4 and 0.5 cm anterolisthesis L4 on L5.  Vertebrae: No fracture worrisome lesion. Degenerative endplate signal change is seen from L2-3 to L5-S1, worst at L3-4 eccentric to the right.  Conus medullaris: Extends to the L2 level and appears normal.  Paraspinal and other soft tissues: The descending abdominal aorta measures up to 3.4 cm in diameter.  Disc levels:  T10-11, T11-12 and T12-L1 are imaged in the sagittal plane only. Small right paracentral protrusions seen at T10-11 and T11-12 but the central canal and foramina appear open at each level.  L1-2:  Moderate facet arthropathy.  Otherwise negative.  L2-3: Moderate facet arthropathy. Shallow disc bulge with endplate spur is identified. Mild to moderate central canal narrowing is seen. The foramina are open.  L3-4: Advanced facet degenerative disease and bulky ligamentum flavum thickening are present. The disc is uncovered with a bulge. There is severe central canal and bilateral subarticular recess narrowing. Moderate to moderately severe foraminal narrowing is worse on the left.  L4-5: The disc is uncovered with a shallow bulge and there is advanced facet degenerative change. There is moderate to moderately severe central canal and bilateral foraminal narrowing. Foraminal narrowing appears slightly worse on the right.  L5-S1: Shallow broad-based disc bulge mildly indents the ventral thecal sac. Moderate to moderately severe foraminal narrowing is worse on the right.  IMPRESSION: Severe central canal and bilateral subarticular recess narrowing at L3-4. Moderate to moderately severe foraminal narrowing at this level is worse on the left.  Moderate to moderately severe central canal and bilateral foraminal narrowing at  L4-5.  Moderate to moderately severe bilateral foraminal narrowing at L5-S1 appears worse on the right.  Small descending abdominal aortic aneurysm measures 3.4 cm in diameter. Recommend followup by ultrasound in 3 years. This recommendation follows ACR consensus guidelines: White Paper of the ACR Incidental Findings Committee II on Vascular Findings. [facility]iol 2013; [phone]   Electronically Signed   By: Inge Rise M.D.   On: 05/04/2017 14:30  She reports that she quit smoking about 5 years ago. Her smoking use included cigarettes. she has never used smokeless tobacco. No results for input(s): HGBA1C, LABURIC in the last 8760 hours.  Objective:  VS:  HT:    WT:   BMI:     BP:113/78  HR:80bpm  TEMP:98.7 F (37.1 C)(Oral)  RESP:97 % Physical Exam  Musculoskeletal:  Brief exam shows good distal strength without clonus.    Ortho Exam Imaging: No results found.  Past Medical/Family/Surgical/Social History: Medications & Allergies reviewed per EMR Patient Active Problem List   Diagnosis Date Noted  . Trochanteric bursitis of both hips 08/17/2016  . Aortic atherosclerosis ([place]) 08/23/2015  . Vitamin B 12 deficiency 08/05/2015  . Rheumatoid arthritis ([place]) 08/05/2015  . Asthma, mild persistent 08/05/2015  . Essential hypertension 08/05/2015  . Cataract 08/05/2015   Past Medical History:  Diagnosis Date  . AAA (abdominal aortic aneurysm) ([place])   . Anemia   . Anxiety   . Arthritis   . Asthma   . Diabetes  mellitus without complication ([place])   . Hyperlipidemia   . Hypertension    Family History  Problem Relation Age of Onset  . AAA (abdominal aortic aneurysm) Mother   . Heart disease Mother    Past Surgical History:  Procedure Laterality Date  . ABDOMINAL HYSTERECTOMY    . APPENDECTOMY    . BUNIONECTOMY    . GANGLION CYST EXCISION     Social History   Occupational History  . Not on file  Tobacco Use  . Smoking status: Former Smoker     Types: Cigarettes    Last attempt to quit: 08/04/2012    Years since quitting: 5.0  . Smokeless tobacco: Never Used  Substance and Sexual Activity  . Alcohol use: No  . Drug use: No  . Sexual activity: Not on file

## 2017-09-04 ENCOUNTER — Telehealth: Payer: Self-pay | Admitting: *Deleted

## 2017-09-04 DIAGNOSIS — J4530 Mild persistent asthma, uncomplicated: Secondary | ICD-10-CM

## 2017-09-04 MED ORDER — BUDESONIDE-FORMOTEROL FUMARATE 80-4.5 MCG/ACT IN AERO: 2.0000 | INHALATION_SPRAY | Freq: Two times a day (BID) | RESPIRATORY_TRACT | 0 refills | Status: DC

## 2017-09-04 MED ORDER — ALBUTEROL SULFATE HFA 108 (90 BASE) MCG/ACT IN AERS: 2.0000 | INHALATION_SPRAY | Freq: Four times a day (QID) | RESPIRATORY_TRACT | 1 refills | Status: DC | PRN

## 2017-09-04 NOTE — Telephone Encounter (Signed)
Pt aware inhalers sent to [facility]

## 2017-09-13 ENCOUNTER — Ambulatory Visit (INDEPENDENT_AMBULATORY_CARE_PROVIDER_SITE_OTHER): Payer: Medicare Other

## 2017-09-13 DIAGNOSIS — Z1211 Encounter for screening for malignant neoplasm of colon: Secondary | ICD-10-CM

## 2017-09-13 NOTE — Progress Notes (Addendum)
Gastroenterology Pre-Procedure Review  Request Date:09/13/17 Requesting Physician: Dr.Vincent ( last tcs done in [place] ?4-5 years ago)   PATIENT REVIEW QUESTIONS: The patient responded to the following health history questions as indicated:    1. Diabetes Melitis: no 2. Joint replacements in the past 12 months: no 3. Major health problems in the past 3 months: no 4. Has an artificial valve or MVP: no 5. Has a defibrillator: no 6. Has been advised in past to take antibiotics in advance of a procedure like teeth cleaning: no 7. Family history of colon cancer: no  8. Alcohol Use: no 9. History of sleep apnea: no  10. History of coronary artery or other vascular stents placed within the last 12 months: no 11. History of any prior anesthesia complications: no    MEDICATIONS & ALLERGIES:    Patient reports the following regarding taking any blood thinners:   Plavix? no Aspirin? no Coumadin? no Brilinta? no Xarelto? no Eliquis? no Pradaxa? no Savaysa? no Effient? no  Patient confirms/reports the following medications:  Current Outpatient Medications  Medication Sig Dispense Refill  . albuterol (PROVENTIL HFA;VENTOLIN HFA) 108 (90 Base) MCG/ACT inhaler Inhale 2 puffs into the lungs every 6 (six) hours as needed for wheezing or shortness of breath. 3 Inhaler 1  . amLODipine (NORVASC) 5 MG tablet TAKE ONE TABLET BY MOUTH ONCE DAILY 90 tablet 1  . aspirin 81 MG tablet Take 81 mg by mouth daily.    . budesonide-formoterol (SYMBICORT) 80-4.5 MCG/ACT inhaler Inhale 2 puffs into the lungs 2 (two) times daily. 3 Inhaler 0  . calcium-vitamin D (OSCAL WITH D) 500-200 MG-UNIT tablet Take 1 tablet by mouth.    . Cholecalciferol (VITAMIN D) 2000 units CAPS Take by mouth daily.    . metoprolol succinate (TOPROL-XL) 25 MG 24 hr tablet Take 1 tablet (25 mg total) by mouth daily. 90 tablet 0  . montelukast (SINGULAIR) 10 MG tablet Take 1 tablet (10 mg total) by mouth at bedtime. 90 tablet 0  .  naproxen sodium (ALEVE) 220 MG tablet Take 220 mg by mouth daily as needed.    . pravastatin (PRAVACHOL) 40 MG tablet Take 1 tablet (40 mg total) by mouth daily. 90 tablet 0  . ramipril (ALTACE) 10 MG capsule Take 1 capsule (10 mg total) by mouth daily. 90 capsule 0  . vitamin B-12 (CYANOCOBALAMIN) 100 MCG tablet Take 100 mcg by mouth daily.    . vitamin C (ASCORBIC ACID) 250 MG tablet Take 250 mg by mouth daily.    . benzonatate (TESSALON) 100 MG capsule Take 1 capsule (100 mg total) by mouth 2 (two) times daily as needed for cough. (Patient not taking: Reported on 09/13/2017) 20 capsule 0  . linaclotide (LINZESS) 72 MCG capsule Take 1 capsule (72 mcg total) by mouth daily before breakfast. (Patient not taking: Reported on 09/13/2017) 30 capsule 2   No current facility-administered medications for this visit.     Patient confirms/reports the following allergies:  Allergies  Allergen Reactions  . Penicillins Anaphylaxis  . Codeine Itching    No orders of the defined types were placed in this encounter.   AUTHORIZATION INFORMATION Primary Insurance: [employer] ,  ID #: [id number] Pre-Cert / Josem Kaufmann required: no  SCHEDULE INFORMATION: Procedure has been scheduled as follows:  Date: 11/09/17, Time: 2:00 Location: [facility] Dr[facility]  This Gastroenterology Pre-Precedure Review Form is being routed to the following provider(s): Walden Field NP

## 2017-09-13 NOTE — Progress Notes (Signed)
Ok to schedule.

## 2017-09-13 NOTE — Progress Notes (Signed)
Pt came in for a nurse visit. She had a previous tcs in [place] 4-5 years ago. She is going back to [place] at the end of this month and will be gone for a whole month. When she comes back in May, she is going to have cataract surgery but she is not sure of the date for that yet. Pt has been triaged and we do not have anything available before she leaves. I discussed this with the pt and she is going to call me when she finds out when her cataract surgery is and we will try to schedule her tcs for when she is home. I will update triage by phone and mail instructions to the pt. She is in agreement with this. Manuela Schwartz is going to try to get last tcs records from [place].

## 2017-09-15 ENCOUNTER — Other Ambulatory Visit: Payer: Self-pay | Admitting: Pediatrics

## 2017-09-15 DIAGNOSIS — J4530 Mild persistent asthma, uncomplicated: Secondary | ICD-10-CM

## 2017-09-15 DIAGNOSIS — I700 Atherosclerosis of aorta: Secondary | ICD-10-CM

## 2017-09-15 DIAGNOSIS — I10 Essential (primary) hypertension: Secondary | ICD-10-CM

## 2017-09-17 ENCOUNTER — Telehealth: Payer: Self-pay

## 2017-09-17 NOTE — Telephone Encounter (Signed)
Pt called to let JL know that she could schedule her colonoscopy anytime between April 14 to May 5. Please call her at [phone]

## 2017-09-20 NOTE — Telephone Encounter (Signed)
Tried to call pt at number given- NA, tried to call number in the chart and the phone was busy.

## 2017-09-21 NOTE — Telephone Encounter (Signed)
Tried to call number given, NA. Tried to call pts cell #- LMOM

## 2017-09-26 ENCOUNTER — Telehealth: Payer: Self-pay | Admitting: *Deleted

## 2017-09-26 ENCOUNTER — Telehealth: Payer: Self-pay

## 2017-09-26 MED ORDER — PEG 3350-KCL-NA BICARB-NACL 420 G PO SOLR: 4000.0000 mL | ORAL | 0 refills | Status: DC

## 2017-09-26 NOTE — Telephone Encounter (Signed)
Spoke with the pt- see other phone note.  

## 2017-09-26 NOTE — Telephone Encounter (Signed)
[phone] patient returned call, please call back

## 2017-09-26 NOTE — Addendum Note (Signed)
Addended by: Claudina Lick on: 09/26/2017 03:51 PM   Modules accepted: Orders, SmartSet

## 2017-09-26 NOTE — Telephone Encounter (Signed)
Spoke with the pt, see other phone note.  

## 2017-09-26 NOTE — Patient Instructions (Addendum)
Anne Strong   09/14/1945 MRN: [id number]    Procedure Date: 11/09/17 Time to register: 1:00 Place to register: [facility] Procedure Time: 2:00 Scheduled provider: Barney Drain, MD  PREPARATION FOR COLONOSCOPY WITH TRI-LYTE SPLIT PREP  Please notify us immediately if you are diabetic, take iron supplements, or if you are on Coumadin or any other blood thinners.     You will need to purchase 1 fleet enema and 1 box of Bisacodyl 55m tablets when you pick up your prep from the pharmacy.  1 DAY BEFORE PROCEDURE:  DATE: 11/08/17  DAY: Thursday Continue clear liquids the entire day - NO SOLID FOOD.     At 2:00 pm:  Take 2 Bisacodyl tablets.   At 4:00pm:  Start drinking your solution. Make sure you mix well per instructions on the bottle. Try to drink 1 (one) 8 ounce glass every 10-15 minutes until you have consumed HALF the jug. You should complete by 6:00pm.You must keep the left over solution refrigerated until completed next day.  Continue clear liquids. You must drink plenty of clear liquids to prevent dehyration and kidney failure. Nothing to eat or drink after midnight.  EXCEPTION: If you take medications for your heart, blood pressure or breathing, you may take these medications with a small amount of clear liquid.    DAY OF PROCEDURE:   DATE: 11/09/17   DAY: Friday   Five hours before your procedure time @ 9:00am:  Finish remaining amout of bowel prep, drinking 1 (one) 8 ounce glass every 10-15 minutes until complete. You have two hours to consume remaining prep.   Three hours before your procedure time _0 :00am:  Nothing by mouth.   At least one hour before going to the hospital:  Give yourself one Fleet enema. You may take your morning medications with sip of water unless we have instructed otherwise.      Please see below for Dietary Information.  CLEAR LIQUIDS INCLUDE:  Water Jello (NOT red in color)   Ice Popsicles (NOT red in color)   Tea (sugar ok, no  milk/cream) Powdered fruit flavored drinks  Coffee (sugar ok, no milk/cream) Gatorade/ Lemonade/ Kool-Aid  (NOT red in color)   Juice: apple, white grape, white cranberry Soft drinks  Clear bullion, consomme, broth (fat free beef/chicken/vegetable)  Carbonated beverages (any kind)  Strained chicken noodle soup Hard Candy   Remember: Clear liquids are liquids that will allow you to see your fingers on the other side of a clear glass. Be sure liquids are NOT red in color, and not cloudy, but CLEAR.  DO NOT EAT OR DRINK ANY OF THE FOLLOWING:  Dairy products of any kind   Cranberry juice Tomato juice / V8 juice   Grapefruit juice Orange juice     Red grape juice  Do not eat any solid foods, including such foods as: cereal, oatmeal, yogurt, fruits, vegetables, creamed soups, eggs, bread, crackers, pureed foods in a blender, etc.   HELPFUL HINTS FOR DRINKING PREP SOLUTION:   Make sure prep is extremely cold. Mix and refrigerate the the morning of the prep. You may also put in the freezer.   You may try mixing some Crystal Light or Country Time Lemonade if you prefer. Mix in small amounts; add more if necessary.  Try drinking through a straw  Rinse mouth with water or a mouthwash between glasses, to remove after-taste.  Try sipping on a cold beverage /ice/ popsicles between glasses of prep.  Place a  piece of sugar-free hard candy in mouth between glasses.  If you become nauseated, try consuming smaller amounts, or stretch out the time between glasses. Stop for 30-60 minutes, then slowly start back drinking.        OTHER INSTRUCTIONS  You will need a responsible adult at least 70 years of age to accompany you and drive you home. This person must remain in the waiting room during your procedure. The hospital will cancel your procedure if you do not have a responsible adult with you.   1. Wear loose fitting clothing that is easily removed. 2. Leave jewelry and other valuables at  home.  3. Remove all body piercing jewelry and leave at home. 4. Total time from sign-in until discharge is approximately 2-3 hours. 5. You should go home directly after your procedure and rest. You can resume normal activities the day after your procedure. 6. The day of your procedure you should not:  Drive  Make legal decisions  Operate machinery  Drink alcohol  Return to work   You may call the office (Dept: [phone]) before 5:00pm, or page the doctor on call [phone]) after 5:00pm, for further instructions, if necessary.   Insurance Information YOU WILL NEED TO CHECK WITH YOUR INSURANCE COMPANY FOR THE BENEFITS OF COVERAGE YOU HAVE FOR THIS PROCEDURE.  UNFORTUNATELY, NOT ALL INSURANCE COMPANIES HAVE BENEFITS TO COVER ALL OR PART OF THESE TYPES OF PROCEDURES.  IT IS YOUR RESPONSIBILITY TO CHECK YOUR BENEFITS, HOWEVER, WE WILL BE GLAD TO ASSIST YOU WITH ANY CODES YOUR INSURANCE COMPANY MAY NEED.    PLEASE NOTE THAT MOST INSURANCE COMPANIES WILL NOT COVER A SCREENING COLONOSCOPY FOR PEOPLE UNDER THE AGE OF 50  IF YOU HAVE BC[employer] INSURANCE, YOU MAY HAVE BENEFITS FOR A SCREENING COLONOSCOPY BUT IF POLYPS ARE FOUND THE DIAGNOSIS WILL CHANGE AND THEN YOU MAY HAVE A DEDUCTIBLE THAT WILL NEED TO BE MET. SO PLEASE MAKE SURE YOU CHECK YOUR BENEFITS FOR A SCREENING COLONOSCOPY AS WELL AS A DIAGNOSTIC COLONOSCOPY.

## 2017-09-26 NOTE — Telephone Encounter (Signed)
I spoke with the pt, nothing in her triage has changed. Scheduled her for 11/09/17 with SLF at 2:00pm. I have mailed all instructions and paperwork to the pt. rx sent to the pharmacy.   Routing to EG for FYI since he signed off on original triage.

## 2017-09-26 NOTE — Progress Notes (Signed)
Pt called- she is scheduled for 11/09/17 with SLF. I have mailed instructions to her and prep has been sent in. Triage updated. No new information or changes.  GI doctor in [place] did not have any records.

## 2017-09-26 NOTE — Telephone Encounter (Signed)
Pt said that she had called earlier this morning and LMOM for JL. Pt called back to say that she would be back home after 2pm and for JL to call [phone]

## 2017-09-28 NOTE — Telephone Encounter (Signed)
Noted  

## 2017-11-06 ENCOUNTER — Telehealth: Payer: Self-pay | Admitting: Gastroenterology

## 2017-11-06 NOTE — Telephone Encounter (Signed)
Please call patient at [phone] or [phone]. She has questions about her prep and what she needs to do to get prepared for her colonoscopy.

## 2017-11-06 NOTE — Telephone Encounter (Signed)
Spoke with the pt and answered her questions.

## 2017-11-08 ENCOUNTER — Telehealth: Payer: Self-pay

## 2017-11-08 NOTE — Telephone Encounter (Signed)
Tried to call pt to see if she can arrive earlier for TCS tomorrow, no answer, LMOVM.  Pt then called office. She is unable to have TCS earlier d/t transportation.

## 2017-11-09 ENCOUNTER — Ambulatory Visit (HOSPITAL_COMMUNITY)
Admission: RE | Admit: 2017-11-09 | Discharge: 2017-11-09 | Disposition: A | Discharge status: Home / Self Care | Payer: Medicare Other | Source: Ambulatory Visit | Attending: Gastroenterology | Admitting: Gastroenterology

## 2017-11-09 ENCOUNTER — Encounter (HOSPITAL_COMMUNITY)
Admission: RE | Disposition: A | Discharge status: Home / Self Care | Payer: Self-pay | Source: Ambulatory Visit | Attending: Gastroenterology

## 2017-11-09 ENCOUNTER — Encounter (HOSPITAL_COMMUNITY): Payer: Self-pay

## 2017-11-09 ENCOUNTER — Telehealth: Payer: Self-pay | Admitting: *Deleted

## 2017-11-09 ENCOUNTER — Other Ambulatory Visit: Payer: Self-pay

## 2017-11-09 DIAGNOSIS — I10 Essential (primary) hypertension: Secondary | ICD-10-CM | POA: Diagnosis not present

## 2017-11-09 DIAGNOSIS — Z7982 Long term (current) use of aspirin: Secondary | ICD-10-CM | POA: Diagnosis not present

## 2017-11-09 DIAGNOSIS — E119 Type 2 diabetes mellitus without complications: Secondary | ICD-10-CM | POA: Diagnosis not present

## 2017-11-09 DIAGNOSIS — M1990 Unspecified osteoarthritis, unspecified site: Secondary | ICD-10-CM | POA: Diagnosis not present

## 2017-11-09 DIAGNOSIS — Z87891 Personal history of nicotine dependence: Secondary | ICD-10-CM | POA: Diagnosis not present

## 2017-11-09 DIAGNOSIS — E785 Hyperlipidemia, unspecified: Secondary | ICD-10-CM | POA: Diagnosis not present

## 2017-11-09 DIAGNOSIS — Z1211 Encounter for screening for malignant neoplasm of colon: Secondary | ICD-10-CM

## 2017-11-09 DIAGNOSIS — Z885 Allergy status to narcotic agent status: Secondary | ICD-10-CM | POA: Insufficient documentation

## 2017-11-09 DIAGNOSIS — I714 Abdominal aortic aneurysm, without rupture: Secondary | ICD-10-CM | POA: Diagnosis not present

## 2017-11-09 DIAGNOSIS — Z880 Allergy status to penicillin: Secondary | ICD-10-CM | POA: Diagnosis not present

## 2017-11-09 DIAGNOSIS — Z1212 Encounter for screening for malignant neoplasm of rectum: Secondary | ICD-10-CM

## 2017-11-09 DIAGNOSIS — D120 Benign neoplasm of cecum: Secondary | ICD-10-CM | POA: Diagnosis not present

## 2017-11-09 DIAGNOSIS — J45909 Unspecified asthma, uncomplicated: Secondary | ICD-10-CM | POA: Diagnosis not present

## 2017-11-09 DIAGNOSIS — Q438 Other specified congenital malformations of intestine: Secondary | ICD-10-CM | POA: Diagnosis not present

## 2017-11-09 DIAGNOSIS — Z79899 Other long term (current) drug therapy: Secondary | ICD-10-CM | POA: Insufficient documentation

## 2017-11-09 DIAGNOSIS — K648 Other hemorrhoids: Secondary | ICD-10-CM | POA: Diagnosis not present

## 2017-11-09 HISTORY — PX: COLONOSCOPY: SHX5424

## 2017-11-09 SURGERY — COLONOSCOPY
Anesthesia: Moderate Sedation

## 2017-11-09 MED ORDER — MEPERIDINE HCL 100 MG/ML IJ SOLN
INTRAMUSCULAR | Status: DC | PRN
  Administered 2017-11-09 (×2): 25 mg via INTRAVENOUS

## 2017-11-09 MED ORDER — MEPERIDINE HCL 100 MG/ML IJ SOLN
INTRAMUSCULAR | Status: AC
  Filled 2017-11-09: qty 2

## 2017-11-09 MED ORDER — MIDAZOLAM HCL 5 MG/5ML IJ SOLN
INTRAMUSCULAR | Status: AC
  Filled 2017-11-09: qty 10

## 2017-11-09 MED ORDER — SIMETHICONE 40 MG/0.6ML PO SUSP
ORAL | Status: DC | PRN
  Administered 2017-11-09: 15 mL

## 2017-11-09 MED ORDER — SODIUM CHLORIDE 0.9 % IV SOLN
INTRAVENOUS | Status: DC
  Administered 2017-11-09: 14:00:00 via INTRAVENOUS

## 2017-11-09 MED ORDER — MIDAZOLAM HCL 5 MG/5ML IJ SOLN
INTRAMUSCULAR | Status: DC | PRN
  Administered 2017-11-09 (×2): 2 mg via INTRAVENOUS

## 2017-11-09 NOTE — Telephone Encounter (Signed)
Patient called stating she is feeling SOB (d/t her asthma) and is getting ready to do a neb tx. She did not want to cancel her TCS as she has already started prepping. Denies any CP. She is able to speak full sentences without giving out of breath.   Called over to Endo and spoke with Anitra. Patient can still have TCS done as long as she can speak full sentences.  Called made pt aware

## 2017-11-09 NOTE — Discharge Instructions (Signed)
You had 1 polyp removed. You have  SMALL internal and MODERATE SIZE EXTERNAL hemorrhoids.   DRINK WATER TO KEEP YOUR URINE LIGHT YELLOW.  FOLLOW A HIGH FIBER DIET. AVOID ITEMS THAT CAUSE BLOATING & GAS. SEE INFO BELOW.  YOUR BIOPSY RESULTS WILL BE AVAILABLE IN 7 DAYS.   Next colonoscopy in 10-15 years IF THE BENEFITS OUTWEIGH THE RISKS.    Colonoscopy Care After Read the instructions outlined below and refer to this sheet in the next week. These discharge instructions provide you with general information on caring for yourself after you leave the hospital. While your treatment has been planned according to the most current medical practices available, unavoidable complications occasionally occur. If you have any problems or questions after discharge, call DR. Channon Ambrosini, [phone].  ACTIVITY  You may resume your regular activity, but move at a slower pace for the next 24 hours.   Take frequent rest periods for the next 24 hours.   Walking will help get rid of the air and reduce the bloated feeling in your belly (abdomen).   No driving for 24 hours (because of the medicine (anesthesia) used during the test).   You may shower.   Do not sign any important legal documents or operate any machinery for 24 hours (because of the anesthesia used during the test).    NUTRITION  Drink plenty of fluids.   You may resume your normal diet as instructed by your doctor.   Begin with a light meal and progress to your normal diet. Heavy or fried foods are harder to digest and may make you feel sick to your stomach (nauseated).   Avoid alcoholic beverages for 24 hours or as instructed.    MEDICATIONS  You may resume your normal medications.   WHAT YOU CAN EXPECT TODAY  Some feelings of bloating in the abdomen.   Passage of more gas than usual.   Spotting of blood in your stool or on the toilet paper  .  IF YOU HAD POLYPS REMOVED DURING THE COLONOSCOPY:  Eat a soft diet IF YOU  HAVE NAUSEA, BLOATING, ABDOMINAL PAIN, OR VOMITING.    FINDING OUT THE RESULTS OF YOUR TEST Not all test results are available during your visit. DR. Oneida Alar WILL CALL YOU WITHIN 14 DAYS OF YOUR PROCEDUE WITH YOUR RESULTS. Do not assume everything is normal if you have not heard from DR. Chelcie Estorga, CALL HER OFFICE AT [phone].  SEEK IMMEDIATE MEDICAL ATTENTION AND CALL THE OFFICE: [phone] IF:  You have more than a spotting of blood in your stool.   Your belly is swollen (abdominal distention).   You are nauseated or vomiting.   You have a temperature over 101F.   You have abdominal pain or discomfort that is severe or gets worse throughout the day.   High-Fiber Diet A high-fiber diet changes your normal diet to include more whole grains, legumes, fruits, and vegetables. Changes in the diet involve replacing refined carbohydrates with unrefined foods. The calorie level of the diet is essentially unchanged. The Dietary Reference Intake (recommended amount) for adult males is 38 grams per day. For adult females, it is 25 grams per day. Pregnant and lactating women should consume 28 grams of fiber per day. Fiber is the intact part of a plant that is not broken down during digestion. Functional fiber is fiber that has been isolated from the plant to provide a beneficial effect in the body. PURPOSE  Increase stool bulk.   Ease and regulate bowel movements.  Lower cholesterol.   REDUCE RISK OF COLON CANCER  INDICATIONS THAT YOU NEED MORE FIBER  Constipation and hemorrhoids.   Uncomplicated diverticulosis (intestine condition) and irritable bowel syndrome.   Weight management.   As a protective measure against hardening of the arteries (atherosclerosis), diabetes, and cancer.   GUIDELINES FOR INCREASING FIBER IN THE DIET  Start adding fiber to the diet slowly. A gradual increase of about 5 more grams (2 slices of whole-wheat bread, 2 servings of most fruits or vegetables, or  1 bowl of high-fiber cereal) per day is best. Too rapid an increase in fiber may result in constipation, flatulence, and bloating.   Drink enough water and fluids to keep your urine clear or pale yellow. Water, juice, or caffeine-free drinks are recommended. Not drinking enough fluid may cause constipation.   Eat a variety of high-fiber foods rather than one type of fiber.   Try to increase your intake of fiber through using high-fiber foods rather than fiber pills or supplements that contain small amounts of fiber.   The goal is to change the types of food eaten. Do not supplement your present diet with high-fiber foods, but replace foods in your present diet.   INCLUDE A VARIETY OF FIBER SOURCES  Replace refined and processed grains with whole grains, canned fruits with fresh fruits, and incorporate other fiber sources. White rice, white breads, and most bakery goods contain little or no fiber.   Brown whole-grain rice, buckwheat oats, and many fruits and vegetables are all good sources of fiber. These include: broccoli, Brussels sprouts, cabbage, cauliflower, beets, sweet potatoes, white potatoes (skin on), carrots, tomatoes, eggplant, squash, berries, fresh fruits, and dried fruits.   Cereals appear to be the richest source of fiber. Cereal fiber is found in whole grains and bran. Bran is the fiber-rich outer coat of cereal grain, which is largely removed in refining. In whole-grain cereals, the bran remains. In breakfast cereals, the largest amount of fiber is found in those with "bran" in their names. The fiber content is sometimes indicated on the label.   You may need to include additional fruits and vegetables each day.   In baking, for 1 cup white flour, you may use the following substitutions:   1 cup whole-wheat flour minus 2 tablespoons.   1/2 cup white flour plus 1/2 cup whole-wheat flour.   Polyps, Colon  A polyp is extra tissue that grows inside your body. Colon polyps grow  in the large intestine. The large intestine, also called the colon, is part of your digestive system. It is a long, hollow tube at the end of your digestive tract where your body makes and stores stool. Most polyps are not dangerous. They are benign. This means they are not cancerous. But over time, some types of polyps can turn into cancer. Polyps that are smaller than a pea are usually not harmful. But larger polyps could someday become or may already be cancerous. To be safe, doctors remove all polyps and test them.   WHO GETS POLYPS? Anyone can get polyps, but certain people are more likely than others. You may have a greater chance of getting polyps if:  You are over 50.   You have had polyps before.   Someone in your family has had polyps.   Someone in your family has had cancer of the large intestine.   Find out if someone in your family has had polyps. You may also be more likely to get polyps if you:  Eat a lot of fatty foods   Smoke   Drink alcohol   Do not exercise  Eat too much   PREVENTION There is not one sure way to prevent polyps. You might be able to lower your risk of getting them if you:  Eat more fruits and vegetables and less fatty food.   Do not smoke.   Avoid alcohol.   Exercise every day.   Lose weight if you are overweight.   Eating more calcium and folate can also lower your risk of getting polyps. Some foods that are rich in calcium are milk, cheese, and broccoli. Some foods that are rich in folate are chickpeas, kidney beans, and spinach.   Hemorrhoids Hemorrhoids are dilated (enlarged) veins around the rectum. Sometimes clots will form in the veins. This makes them swollen and painful. These are called thrombosed hemorrhoids. Causes of hemorrhoids include:  Constipation.   Straining to have a bowel movement.   HEAVY LIFTING  HOME CARE INSTRUCTIONS  Eat a well balanced diet and drink 6 to 8 glasses of water every day to avoid constipation.  You may also use a bulk laxative.   Avoid straining to have bowel movements.   Keep anal area dry and clean.   Do not use a donut shaped pillow or sit on the toilet for long periods. This increases blood pooling and pain.   Move your bowels when your body has the urge; this will require less straining and will decrease pain and pressure.

## 2017-11-09 NOTE — H&P (Signed)
Primary Care Physician:  Eustaquio Maize, MD Primary Gastroenterologist:  Dr. Oneida Alar  Pre-Procedure History & Physical: HPI:  Anne Strong is a 72 y.o. female here for [place].  Past Medical History:  Diagnosis Date  . AAA (abdominal aortic aneurysm) ([place])   . Anemia   . Anxiety   . Arthritis   . Asthma   . Diabetes mellitus without complication ([place])   . Hyperlipidemia   . Hypertension     Past Surgical History:  Procedure Laterality Date  . ABDOMINAL HYSTERECTOMY    . APPENDECTOMY    . BUNIONECTOMY    . GANGLION CYST EXCISION      Prior to Admission medications   Medication Sig Start Date End Date Taking? Authorizing Provider  albuterol (PROVENTIL HFA;VENTOLIN HFA) 108 (90 Base) MCG/ACT inhaler Inhale 2 puffs into the lungs every 6 (six) hours as needed for wheezing or shortness of breath. 09/04/17  Yes Eustaquio Maize, MD  amLODipine (NORVASC) 5 MG tablet Take 1 tablet (5 mg total) by mouth daily. 09/17/17  Yes Eustaquio Maize, MD  aspirin 81 MG tablet Take 81 mg by mouth daily.   Yes [provider]  benzonatate (TESSALON) 100 MG capsule Take 1 capsule (100 mg total) by mouth 2 (two) times daily as needed for cough. 08/13/17  Yes Eustaquio Maize, MD  budesonide-formoterol [facility]) 80-4.5 MCG/ACT inhaler Inhale 2 puffs into the lungs 2 (two) times daily. 09/04/17  Yes Eustaquio Maize, MD  calcium-vitamin D (OSCAL WITH D) 500-200 MG-UNIT tablet Take 1 tablet by mouth daily.    Yes [provider]  Cholecalciferol (VITAMIN D) 2000 units CAPS Take 2,000 Units by mouth daily.    Yes [provider]  metoprolol succinate (TOPROL-XL) 25 MG 24 hr tablet Take 1 tablet (25 mg total) by mouth daily. 09/17/17  Yes Eustaquio Maize, MD  montelukast (SINGULAIR) 10 MG tablet Take 1 tablet (10 mg total) by mouth at bedtime. 09/17/17  Yes Eustaquio Maize, MD  naproxen sodium (ALEVE) 220 MG tablet Take 220 mg by mouth daily as needed (for pain or  headache).    Yes [provider]  Polyethyl Glycol-Propyl Glycol (SYSTANE OP) Place 2 drops into both eyes daily as needed (for dry eyes).   Yes [provider]  pravastatin (PRAVACHOL) 40 MG tablet Take 1 tablet (40 mg total) by mouth daily. 09/18/17  Yes Eustaquio Maize, MD  ramipril (ALTACE) 10 MG capsule Take 1 capsule (10 mg total) by mouth daily. 09/17/17  Yes Eustaquio Maize, MD  vitamin B-12 (CYANOCOBALAMIN) 100 MCG tablet Take 100 mcg by mouth daily.   Yes [provider]  vitamin C (ASCORBIC ACID) 250 MG tablet Take 250 mg by mouth daily.   Yes [provider]  linaclotide (LINZESS) 72 MCG capsule Take 1 capsule (72 mcg total) by mouth daily before breakfast. Patient not taking: Reported on 09/13/2017 08/13/17   Eustaquio Maize, MD  polyethylene glycol-electrolytes (TRILYTE) 420 g solution Take 4,000 mLs by mouth as directed. Patient not taking: Reported on 11/05/2017 09/26/17   Carlis Stable, NP    Allergies as of 09/26/2017 - Review Complete 09/13/2017  Allergen Reaction Noted  . Penicillins Anaphylaxis 08/05/2015  . Codeine Itching 08/05/2015    Family History  Problem Relation Age of Onset  . AAA (abdominal aortic aneurysm) Mother   . Heart disease Mother     Social History   Socioeconomic History  . Marital  status: Divorced    Spouse name: Not on file  . Number of children: Not on file  . Years of education: Not on file  . Highest education level: Not on file  Occupational History  . Not on file  Social Needs  . Financial resource strain: Not on file  . Food insecurity:    Worry: Not on file    Inability: Not on file  . Transportation needs:    Medical: Not on file    Non-medical: Not on file  Tobacco Use  . Smoking status: Former Smoker    Types: Cigarettes    Last attempt to quit: 08/04/2012    Years since quitting: 5.2  . Smokeless tobacco: Never Used  Substance and Sexual Activity  . Alcohol use: No  . Drug use: No  .  Sexual activity: Not on file  Lifestyle  . Physical activity:    Days per week: Not on file    Minutes per session: Not on file  . Stress: Not on file  Relationships  . Social connections:    Talks on phone: Not on file    Gets together: Not on file    Attends religious service: Not on file    Active member of club or organization: Not on file    Attends meetings of clubs or organizations: Not on file    Relationship status: Not on file  . Intimate partner violence:    Fear of current or ex partner: Not on file    Emotionally abused: Not on file    Physically abused: Not on file    Forced sexual activity: Not on file  Other Topics Concern  . Not on file  Social History Narrative  . Not on file    Review of Systems: See HPI, otherwise negative ROS   Physical Exam: BP (!) 126/96   Pulse 89   Temp 98.1 F (36.7 C) (Oral)   Resp 18   Ht 5\' 4"  (1.626 m)   Wt 179 lb (81.2 kg)   BMI 30.73 kg/m  General:   Alert,  pleasant and cooperative in NAD Head:  Normocephalic and atraumatic. Neck:  Supple; Lungs:  Clear throughout to auscultation.    Heart:  Regular rate and rhythm. Abdomen:  Soft, nontender and nondistended. Normal bowel sounds, without guarding, and without rebound.   Neurologic:  Alert and  oriented x4;  grossly normal neurologically.  Impression/Plan:     SCREENING  Plan:  1. TCS TODAY. DISCUSSED PROCEDURE, BENEFITS, & RISKS: < 1% chance of medication reaction, bleeding, perforation, or rupture of spleen/liver.

## 2017-11-09 NOTE — Op Note (Signed)
[facility] Patient Name: Anne Strong Procedure Date: 11/09/2017 1:55 PM MRN: [id number] Date of Birth: [birth year] Attending MD: Barney Drain MD, MD CSN: [id number] Age: 72 Admit Type: Outpatient Procedure:                Colonoscopy WITH COLD SNARE POLYPECTOMY Indications:              Screening for colorectal malignant neoplasm Providers:                Barney Drain MD, MD, Charlsie Quest. Theda Sers RN, RN,                            Aram Candela Referring MD:             Berlin Hun. Vincent Medicines:                Meperidine 50 mg IV, Midazolam 4 mg IV Complications:            No immediate complications. Estimated Blood Loss:     Estimated blood loss was minimal. Procedure:                Pre-Anesthesia Assessment:                           - Prior to the procedure, a History and Physical                            was performed, and patient medications and                            allergies were reviewed. The patient's tolerance of                            previous anesthesia was also reviewed. The risks                            and benefits of the procedure and the sedation                            options and risks were discussed with the patient.                            All questions were answered, and informed consent                            was obtained. Prior Anticoagulants: The patient has                            taken aspirin, last dose was day of procedure. ASA                            Grade Assessment: II - A patient with mild systemic                            disease. After reviewing the risks and benefits,  the patient was deemed in satisfactory condition to                            undergo the procedure. After obtaining informed                            consent, the colonoscope was passed under direct                            vision. Throughout the procedure, the patient's                            blood pressure,  pulse, and oxygen saturations were                            monitored continuously. The EC-[id number] ([id number])                            scope was introduced through the anus and advanced                            to the the cecum, identified by appendiceal orifice                            and ileocecal valve. The colonoscopy was                            technically difficult and complex due to restricted                            mobility of the colon. Successful completion of the                            procedure was aided by increasing the dose of                            sedation medication, straightening and shortening                            the scope to obtain bowel loop reduction and                            COLOWRAP. The ileocecal valve, appendiceal orifice,                            and rectum were photographed. The patient tolerated                            the procedure fairly well. The quality of the bowel                            preparation was excellent. Scope In: 2:25:57 PM Scope Out: 2:40:45 PM Scope Withdrawal Time: 0 hours 10 minutes 42 seconds  Total Procedure Duration: 0 hours 14 minutes 48 seconds  Findings:      A 6 mm polyp was found in the cecum. The polyp was sessile. The polyp       was removed with a cold snare. Resection and retrieval were complete.      The recto-sigmoid colon and sigmoid colon were significantly redundant.      Internal hemorrhoids were found during retroflexion. The hemorrhoids       were small. Impression:               - One 6 mm polyp in the cecum, removed with a cold                            snare. Resected and retrieved.                           - Redundant colon.                           - Internal hemorrhoids. Moderate Sedation:      Moderate (conscious) sedation was administered by the endoscopy nurse       and supervised by the endoscopist. The following parameters were       monitored: oxygen  saturation, heart rate, blood pressure, and response       to care. Total physician intraservice time was 35 minutes. Recommendation:           - Repeat colonoscopy is not recommended due to                            current age (53 years or older) for surveillance.                           - High fiber diet.                           - Continue present medications.                           - Await pathology results.                           - Patient has a contact number available for                            emergencies. The signs and symptoms of potential                            delayed complications were discussed with the                            patient. Return to normal activities tomorrow.                            Written discharge instructions were provided to the  patient. Procedure Code(s):        --- Professional ---                           [phone], Colonoscopy, flexible; with removal of                            tumor(s), polyp(s), or other lesion(s) by snare                            technique                           G0500, Moderate sedation services provided by the                            same physician or other qualified health care                            professional performing a gastrointestinal                            endoscopic service that sedation supports,                            requiring the presence of an independent trained                            observer to assist in the monitoring of the                            patient's level of consciousness and physiological                            status; initial 15 minutes of intra-service time;                            patient age 57 years or older (additional time may                            be reported with [phone], as appropriate)                           [phone], Moderate sedation services provided by the                            same physician or  other qualified health care                            professional performing the diagnostic or                            therapeutic service that the sedation supports,  requiring the presence of an independent trained                            observer to assist in the monitoring of the                            patient's level of consciousness and physiological                            status; each additional 15 minutes intraservice                            time (List separately in addition to code for                            primary service) Diagnosis Code(s):        --- Professional ---                           Z12.11, Encounter for screening for malignant                            neoplasm of colon                           D12.0, Benign neoplasm of cecum                           K64.8, Other hemorrhoids                           Q43.8, Other specified congenital malformations of                            intestine CPT copyright 2017 [employer]. All rights reserved. The codes documented in this report are preliminary and upon coder review may  be revised to meet current compliance requirements. Barney Drain, MD Barney Drain MD, MD 11/09/2017 2:53:11 PM This report has been signed electronically. Number of Addenda: 0

## 2017-11-13 ENCOUNTER — Other Ambulatory Visit: Payer: Self-pay | Admitting: Pediatrics

## 2017-11-13 DIAGNOSIS — J4530 Mild persistent asthma, uncomplicated: Secondary | ICD-10-CM

## 2017-11-14 ENCOUNTER — Other Ambulatory Visit: Payer: Self-pay | Admitting: *Deleted

## 2017-11-14 DIAGNOSIS — J4530 Mild persistent asthma, uncomplicated: Secondary | ICD-10-CM

## 2017-11-14 MED ORDER — ALBUTEROL SULFATE HFA 108 (90 BASE) MCG/ACT IN AERS: 2.0000 | INHALATION_SPRAY | Freq: Four times a day (QID) | RESPIRATORY_TRACT | 1 refills | Status: DC | PRN

## 2017-11-15 ENCOUNTER — Encounter (HOSPITAL_COMMUNITY): Payer: Self-pay | Admitting: Gastroenterology

## 2017-11-15 ENCOUNTER — Telehealth: Payer: Self-pay | Admitting: Gastroenterology

## 2017-11-15 NOTE — Telephone Encounter (Signed)
Called and no answer.

## 2017-11-15 NOTE — Telephone Encounter (Signed)
ONE SIMPLE ADENOMA REMOVED.   DRINK WATER TO KEEP YOUR URINE LIGHT YELLOW.  FOLLOW A HIGH FIBER DIET. AVOID ITEMS THAT CAUSE BLOATING &   Next colonoscopy in 10-15 years IF THE BENEFITS OUTWEIGH THE RISKS.

## 2017-11-16 NOTE — Telephone Encounter (Signed)
ON RECALL  °

## 2017-11-19 NOTE — Telephone Encounter (Signed)
LMOM to call.

## 2017-11-20 ENCOUNTER — Telehealth: Payer: Self-pay | Admitting: Gastroenterology

## 2017-11-20 NOTE — Telephone Encounter (Signed)
PT is aware.

## 2017-11-20 NOTE — Telephone Encounter (Signed)
See result note. Pt is aware.  

## 2017-11-20 NOTE — Telephone Encounter (Signed)
Patient returned call, please call back  

## 2017-12-13 ENCOUNTER — Other Ambulatory Visit: Payer: Self-pay | Admitting: Pediatrics

## 2017-12-13 DIAGNOSIS — J4530 Mild persistent asthma, uncomplicated: Secondary | ICD-10-CM

## 2017-12-13 DIAGNOSIS — I10 Essential (primary) hypertension: Secondary | ICD-10-CM

## 2018-01-16 IMAGING — MR MR LUMBAR SPINE W/O CM
4 of 5 series · 15 of 48 positions shown · non-contrast
Comparison: None.

CLINICAL DATA: Low back pain radiating into the right leg for 6
months.

EXAM:
MRI LUMBAR SPINE WITHOUT CONTRAST
TECHNIQUE: Multiplanar, multisequence MR imaging of the lumbar spine was
performed. No intravenous contrast was administered.

[Series 3: T2 · sagittal · 4.0mm · 0.69mm/px · 6 of 15 slices shown (1 of 2)]
[im 1/15]
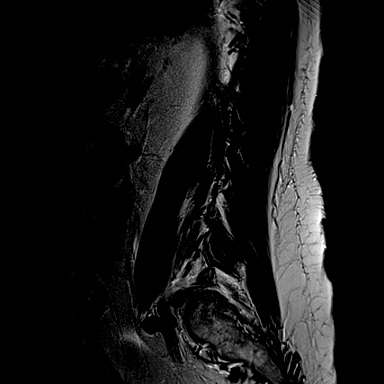
[im 3/15]
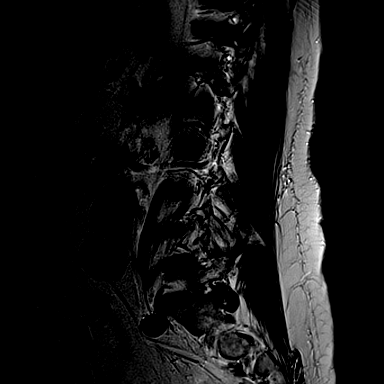
[im 6/15]
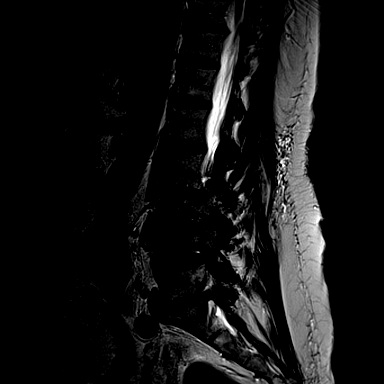
[im 9/15]
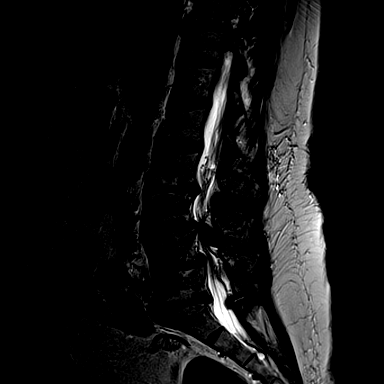
[im 12/15]
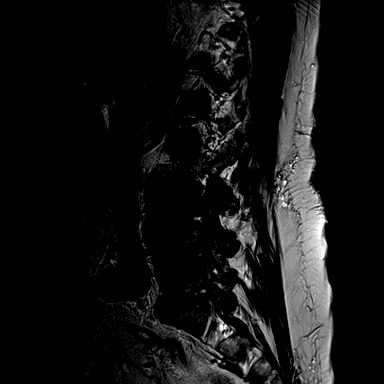
[im 15/15]
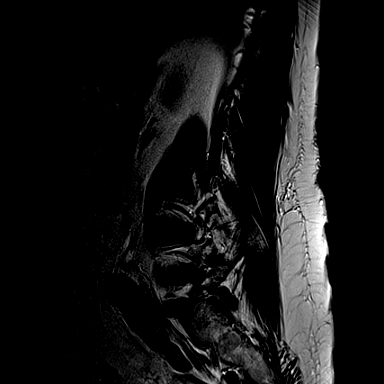

[Series 4: T1 · sagittal · 4.0mm · 0.35mm/px · 3 of 15 slices shown (1 of 2)]
[im 3/15]
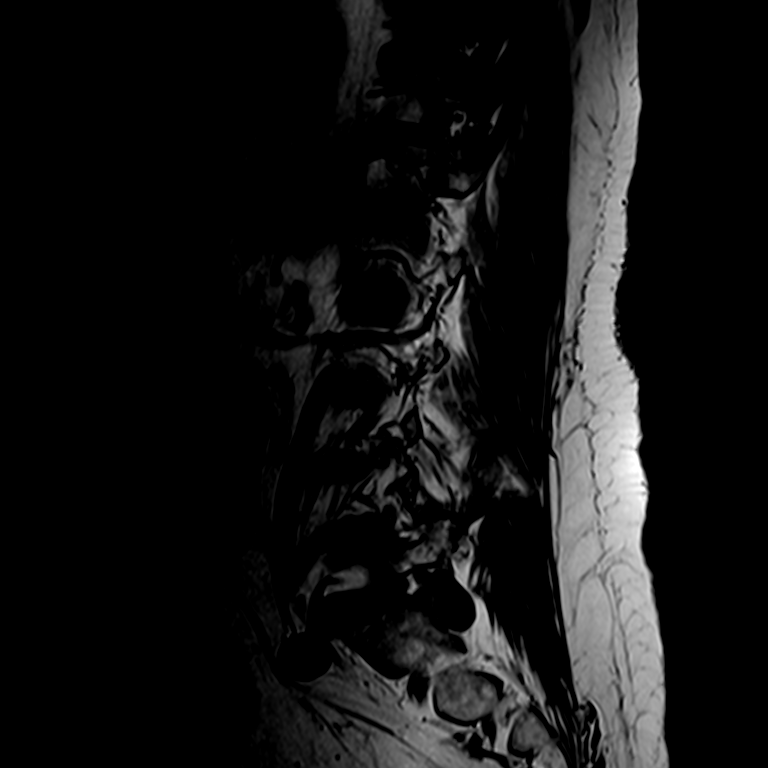
[im 8/15]
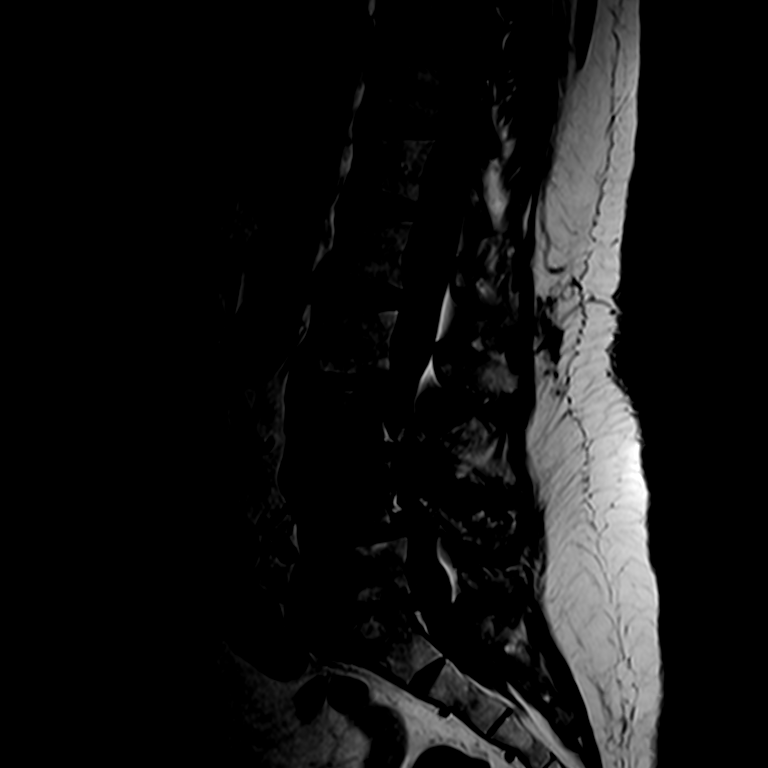
[im 12/15]
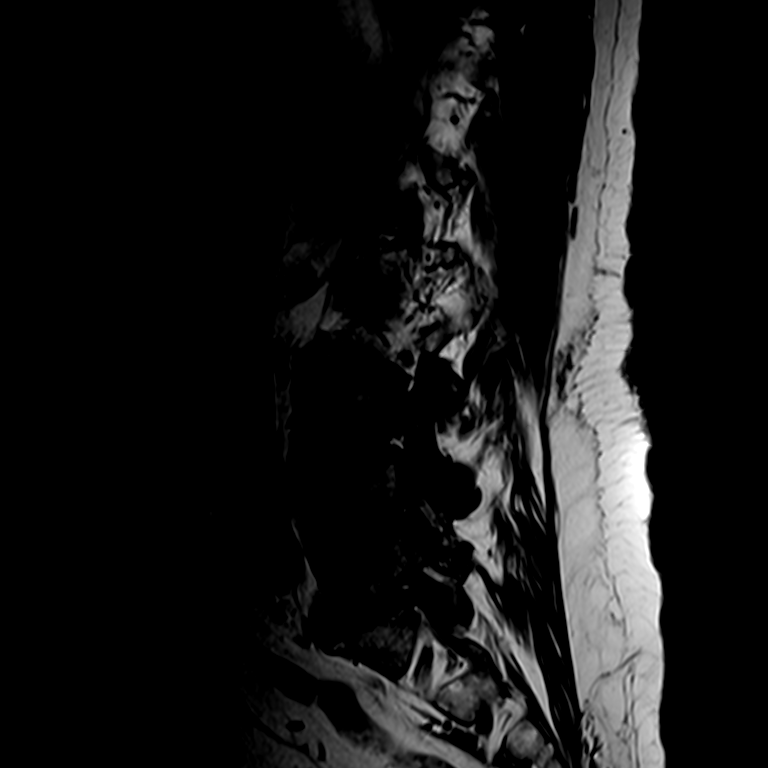

[Series 6: T2 · axial · 4.0mm · 0.22mm/px · z∈[-13,+91]mm · 3 of 30 slices shown (2 of 2)]
[im 5/30]
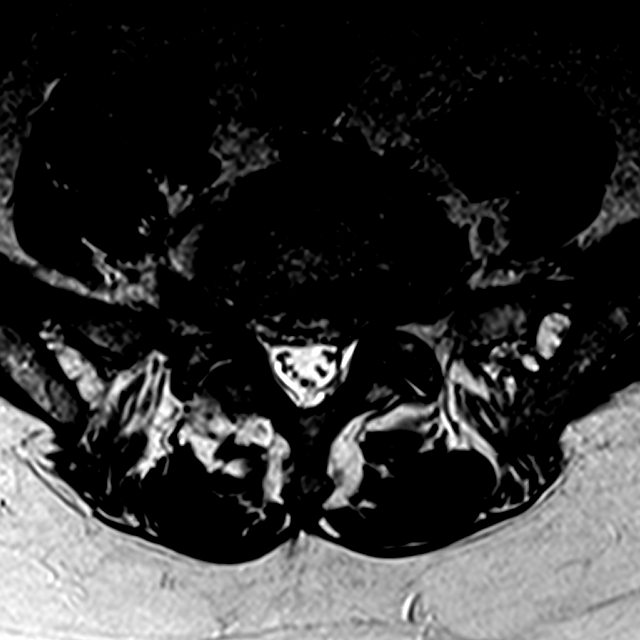
[im 16/30]
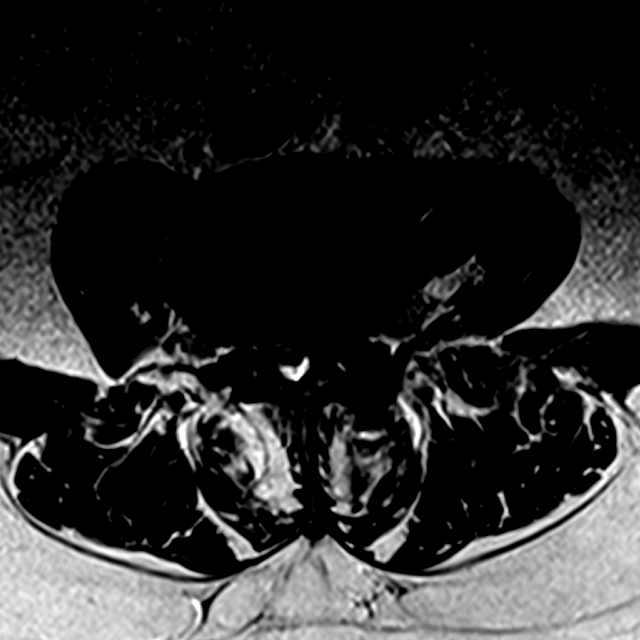
[im 25/30]
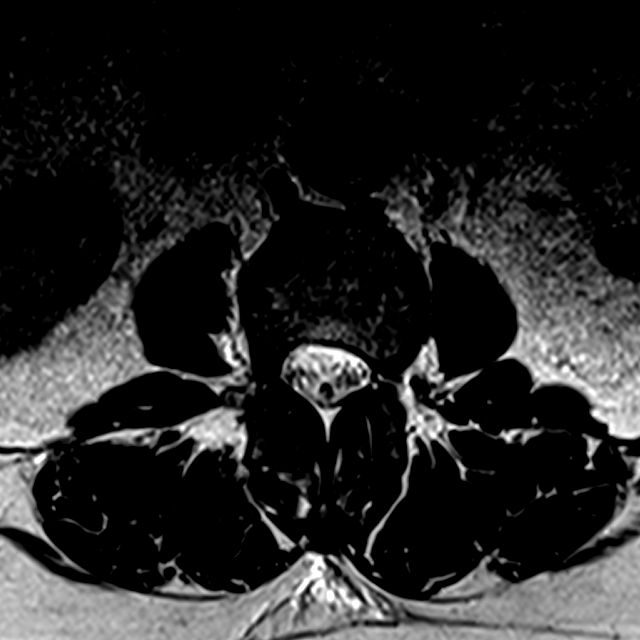

[Series 7: T1 · axial · 4.0mm · 0.22mm/px · z∈[-13,+91]mm · 3 of 30 slices shown (2 of 2)]
[im 5/30]
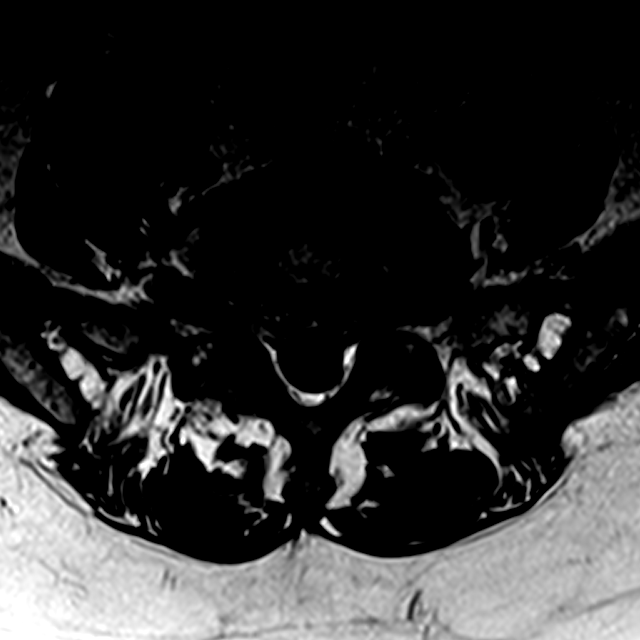
[im 16/30]
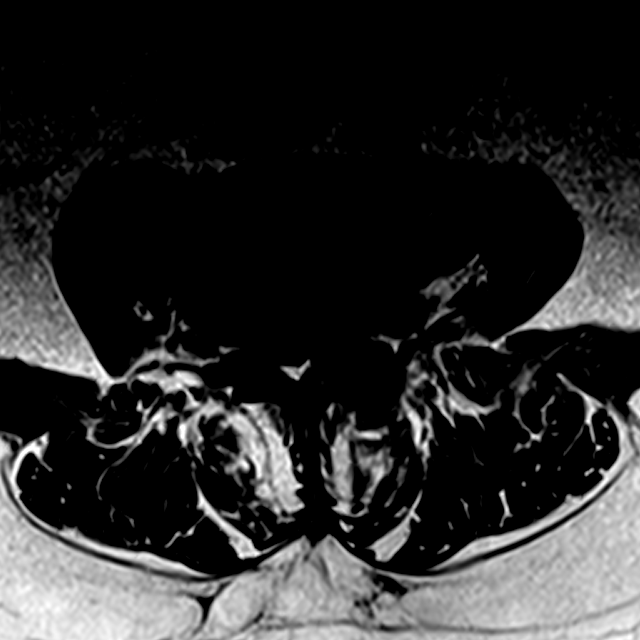
[im 25/30]
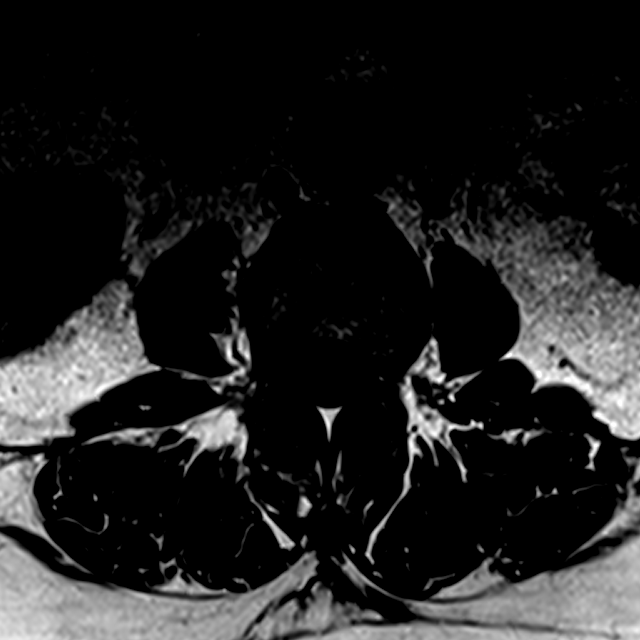

[15 of 48 positions shown; findings below may reference images not displayed]

FINDINGS: Segmentation:  Standard.

Alignment: Facet arthropathy results in 0.3 cm anterolisthesis L3 on
L4 and 0.5 cm anterolisthesis L4 on L5.

Vertebrae: No fracture worrisome lesion. Degenerative endplate
signal change is seen from L2-3 to L5-S1, worst at L3-4 eccentric to
the right.

Conus medullaris: Extends to the L2 level and appears normal.

Paraspinal and other soft tissues: The descending abdominal aorta
measures up to 3.4 cm in diameter.

Disc levels:

T10-11, T11-12 and T12-L1 are imaged in the sagittal plane only.
Small right paracentral protrusions seen at T10-11 and T11-12 but
the central canal and foramina appear open at each level.

L1-2:  Moderate facet arthropathy.  Otherwise negative.

L2-3: Moderate facet arthropathy. Shallow disc bulge with endplate
spur is identified. Mild to moderate central canal narrowing is
seen. The foramina are open.

L3-4: Advanced facet degenerative disease and bulky ligamentum
flavum thickening are present. The disc is uncovered with a bulge.
There is severe central canal and bilateral subarticular recess
narrowing. Moderate to moderately severe foraminal narrowing is
worse on the left.

L4-5: The disc is uncovered with a shallow bulge and there is
advanced facet degenerative change. There is moderate to moderately
severe central canal and bilateral foraminal narrowing. Foraminal
narrowing appears slightly worse on the right.

L5-S1: Shallow broad-based disc bulge mildly indents the ventral
thecal sac. Moderate to moderately severe foraminal narrowing is
worse on the right.
IMPRESSION: Severe central canal and bilateral subarticular recess narrowing at
L3-4. Moderate to moderately severe foraminal narrowing at this
level is worse on the left.

Moderate to moderately severe central canal and bilateral foraminal
narrowing at L4-5.

Moderate to moderately severe bilateral foraminal narrowing at L5-S1
appears worse on the right.

Small descending abdominal aortic aneurysm measures 3.4 cm in
diameter. Recommend followup by ultrasound in 3 years. This
recommendation follows ACR consensus guidelines: White Paper of the
ACR Incidental Findings Committee II on Vascular Findings. [HOSPITAL] 5923; [DATE]

## 2018-02-04 ENCOUNTER — Encounter: Payer: Self-pay | Admitting: Pediatrics

## 2018-02-04 ENCOUNTER — Ambulatory Visit (INDEPENDENT_AMBULATORY_CARE_PROVIDER_SITE_OTHER): Payer: Medicare Other | Admitting: Pediatrics

## 2018-02-04 VITALS — BP 113/77 | HR 70 | Temp 98.3°F | Ht 64.0 in | Wt 174.4 lb

## 2018-02-04 DIAGNOSIS — J4530 Mild persistent asthma, uncomplicated: Secondary | ICD-10-CM | POA: Diagnosis not present

## 2018-02-04 DIAGNOSIS — I10 Essential (primary) hypertension: Secondary | ICD-10-CM

## 2018-02-04 DIAGNOSIS — Z6829 Body mass index (BMI) 29.0-29.9, adult: Secondary | ICD-10-CM

## 2018-02-04 MED ORDER — FLUTICASONE PROPIONATE HFA 110 MCG/ACT IN AERO: 2.0000 | INHALATION_SPRAY | Freq: Two times a day (BID) | RESPIRATORY_TRACT | 2 refills | Status: DC

## 2018-02-04 MED ORDER — AMLODIPINE BESYLATE 5 MG PO TABS: 5.0000 mg | ORAL_TABLET | Freq: Every day | ORAL | 1 refills | Status: DC

## 2018-02-04 MED ORDER — METOPROLOL SUCCINATE ER 25 MG PO TB24: 25.0000 mg | ORAL_TABLET | Freq: Every day | ORAL | 1 refills | Status: DC

## 2018-02-04 MED ORDER — ALBUTEROL SULFATE HFA 108 (90 BASE) MCG/ACT IN AERS: 2.0000 | INHALATION_SPRAY | Freq: Four times a day (QID) | RESPIRATORY_TRACT | 1 refills | Status: DC | PRN

## 2018-02-04 NOTE — Progress Notes (Signed)
  Subjective:   Patient ID: Anne Strong, female    DOB: 09/10/1945, 72 y.o.   MRN: [id number] CC: Medical Management of Chronic Issues  HPI: Anne Strong is a 72 y.o. female   Asthma: Seen in [place] with asthma exacerbation.  Patient says she was told her oxygen level was low at the time.  She was treated with azithromycin, prednisone, albuterol.  She does not like how the Symbicort made her feel, she thought it made albuterol work less well.  She was taking some inhaled budesonide with improvement in symptoms when in [place].  She would like something that does not have to be put in her nebulizer machine.  Hypertension: Taking her medicines regularly.  Rare headaches, no shortness of breath or chest pain.  Elevated BMI: increasing vegetable intake, decreasing red meat  Relevant past medical, surgical, family and social history reviewed. Allergies and medications reviewed and updated. Social History   Tobacco Use  Smoking Status Former Smoker  . Types: Cigarettes  . Last attempt to quit: 08/04/2012  . Years since quitting: 5.5  Smokeless Tobacco Never Used   ROS: Per HPI   Objective:    BP 113/77   Pulse 70   Temp 98.3 F (36.8 C) (Oral)   Ht 5\' 4"  (1.626 m)   Wt 174 lb 6.4 oz (79.1 kg)   BMI 29.94 kg/m   Wt Readings from Last 3 Encounters:  02/04/18 174 lb 6.4 oz (79.1 kg)  11/09/17 179 lb (81.2 kg)  08/13/17 180 lb 9.6 oz (81.9 kg)    Gen: NAD, alert, cooperative with exam, NCAT, dry cough present EYES: EOMI, no conjunctival injection, or no icterus ENT:  OP without erythema LYMPH: no cervical LAD CV: NRRR, normal S1/S2, no murmur, distal pulses 2+ b/l Resp: CTABL, no wheezes, normal WOB Abd: +BS, soft, NTND.  Ext: No edema, warm Neuro: Alert and oriented  Assessment & Plan:  Anne Strong was seen today for medical management of chronic issues.  Diagnoses and all orders for this visit:  Essential hypertension Stable, cont current medicines  Mild persistent  asthma without complication Exacerbation in [place]. Didn't tolerate symbicort, feels better on inhaled steroid alone. Will start flovent, two puffs twice a day. Any worsening in symptoms let me know  Elevated BMI Cont life style changes, regular activity  Follow up plan: 6 mo Assunta Found, MD [place]

## 2018-02-27 ENCOUNTER — Ambulatory Visit (INDEPENDENT_AMBULATORY_CARE_PROVIDER_SITE_OTHER): Payer: Medicare Other | Admitting: *Deleted

## 2018-02-27 ENCOUNTER — Encounter: Payer: Self-pay | Admitting: *Deleted

## 2018-02-27 VITALS — BP 96/68 | HR 67 | Ht 62.5 in | Wt 173.0 lb

## 2018-02-27 DIAGNOSIS — Z0000 Encounter for general adult medical examination without abnormal findings: Secondary | ICD-10-CM

## 2018-02-27 NOTE — Progress Notes (Signed)
Subjective:   Anne Strong is a 72 y.o. female who presents for a Medicare Annual Wellness Visit. Anne Strong lives at home alone. She has 2 daughters and 5 grandchildren. She retired in 2008 and moved here from [place] 3 years ago. She was born in [place] but moved to [place] as a child. She visits her children their many times a year.  She is a member of the [employer] and participates in the [employer] exercise program 3 days a week for 60 min each.   Review of Systems    Patient reports that her overall health is unchanged compared to last year.  Cardiac Risk Factors include: hypertension;dyslipidemia;obesity (BMI >30kg/m2)  Musculoskeletal: Has some bilateral hip pain from her back. Take aleve PRN   All other systems negative       Current Medications (verified) Outpatient Encounter Medications as of 02/27/2018  Medication Sig  . albuterol (PROVENTIL HFA;VENTOLIN HFA) 108 (90 Base) MCG/ACT inhaler Inhale 2 puffs into the lungs every 6 (six) hours as needed for wheezing or shortness of breath.  Marland Kitchen amLODipine (NORVASC) 5 MG tablet Take 1 tablet (5 mg total) by mouth daily.  Marland Kitchen aspirin 81 MG tablet Take 81 mg by mouth daily.  . calcium-vitamin D (OSCAL WITH D) 500-200 MG-UNIT tablet Take 1 tablet by mouth daily.   . Cholecalciferol (VITAMIN D) 2000 units CAPS Take 2,000 Units by mouth daily.   . fluticasone (FLOVENT HFA) 110 MCG/ACT inhaler Inhale 2 puffs into the lungs 2 (two) times daily.  . metoprolol succinate (TOPROL-XL) 25 MG 24 hr tablet Take 1 tablet (25 mg total) by mouth daily.  . montelukast (SINGULAIR) 10 MG tablet Take 1 tablet (10 mg total) by mouth at bedtime.  . naproxen sodium (ALEVE) 220 MG tablet Take 220 mg by mouth daily as needed (for pain or headache).   Vladimir Faster Glycol-Propyl Glycol (SYSTANE OP) Place 2 drops into both eyes daily as needed (for dry eyes).  . pravastatin (PRAVACHOL) 40 MG tablet Take 1 tablet (40 mg total) by mouth daily.  . ramipril  (ALTACE) 10 MG capsule Take 1 capsule (10 mg total) by mouth daily.  . vitamin B-12 (CYANOCOBALAMIN) 100 MCG tablet Take 100 mcg by mouth daily.  . vitamin C (ASCORBIC ACID) 250 MG tablet Take 250 mg by mouth daily.   No facility-administered encounter medications on file as of 02/27/2018.     Allergies (verified) Penicillins and Codeine   History: Past Medical History:  Diagnosis Date  . AAA (abdominal aortic aneurysm) ([place])   . Anemia   . Anxiety   . Arthritis   . Asthma   . Diabetes mellitus without complication ([place])   . Hyperlipidemia   . Hypertension    Past Surgical History:  Procedure Laterality Date  . ABDOMINAL HYSTERECTOMY    . APPENDECTOMY    . BUNIONECTOMY    . COLONOSCOPY N/A 11/09/2017   Procedure: COLONOSCOPY;  Surgeon: Danie Binder, MD;  Location: [facility];  Service: Endoscopy;  Laterality: N/A;  2:00  . GANGLION CYST EXCISION     Family History  Problem Relation Age of Onset  . AAA (abdominal aortic aneurysm) Mother   . Heart disease Mother   . Cancer Mother   . Dementia Mother   . Cancer Father   . Dementia Father   . Healthy Daughter   . Cancer Brother   . Cancer Brother   . Cancer Brother   . Cancer Brother  lung  . Healthy Brother   . Healthy Daughter    Social History   Socioeconomic History  . Marital status: Divorced    Spouse name: Not on file  . Number of children: 2  . Years of education: 56  . Highest education level: 12th grade  Occupational History  . Occupation: Retired    Comment: Lexicographer  . Financial resource strain: Not hard at all  . Food insecurity:    Worry: Never true    Inability: Never true  . Transportation needs:    Medical: No    Non-medical: No  Tobacco Use  . Smoking status: Former Smoker    Types: Cigarettes    Last attempt to quit: 08/04/2012    Years since quitting: 5.6  . Smokeless tobacco: Never Used  Substance and Sexual Activity  . Alcohol use: No  . Drug use: No  .  Sexual activity: Not Currently  Lifestyle  . Physical activity:    Days per week: 3 days    Minutes per session: 60 min  . Stress: Not at all  Relationships  . Social connections:    Talks on phone: More than three times a week    Gets together: More than three times a week    Attends religious service: More than 4 times per year    Active member of club or organization: Yes    Attends meetings of clubs or organizations: More than 4 times per year    Relationship status: Divorced  Other Topics Concern  . Not on file  Social History Narrative  . Not on file    Tobacco Use No.  Clinical Intake:  Pre-visit preparation completed: No  Pain : 0-10 Pain Location: Back Pain Orientation: Right, Left Pain Descriptors / Indicators: Aching Pain Onset: More than a month ago Pain Frequency: Constant Effect of Pain on Daily Activities: minimal     Nutritional Status: BMI > 30  Obese Diabetes: No  How often do you need to have someone help you when you read instructions, pamphlets, or other written materials from your doctor or pharmacy?: 1 - Never What is the last grade level you completed in school?: some college  Interpreter Needed?: No  Information entered by :: Chong Sicilian, RN   Activities of Daily Living In your present state of health, do you have any difficulty performing the following activities: 02/27/2018  Hearing? N  Vision? N  Difficulty concentrating or making decisions? N  Walking or climbing stairs? N  Dressing or bathing? N  Doing errands, shopping? N  Preparing Food and eating ? N  Using the Toilet? N  In the past six months, have you accidently leaked urine? N  Do you have problems with loss of bowel control? N  Managing your Medications? N  Managing your Finances? N  Housekeeping or managing your Housekeeping? N  Some recent data might be hidden      Diet 2 meals most days.  Light breakfast with coffee May have lunch out but cooks  often Drinks mostly water May drink sweet tea with lemon   Exercise Current Exercise Habits: Structured exercise class, Type of exercise: strength training/weights;stretching, Time (Minutes): 60, Frequency (Times/Week): 3, Weekly Exercise (Minutes/Week): 180, Intensity: Moderate, Exercise limited by: orthopedic condition(s)   Depression Screen PHQ 2/9 Scores 02/27/2018 02/27/2018 02/04/2018 08/13/2017 03/22/2017 11/08/2016 08/16/2016  PHQ - 2 Score 0 0 0 0 0 0 0     Fall Risk Fall Risk  02/27/2018 02/04/2018 08/13/2017 03/22/2017 11/08/2016  Falls in the past year? No No No No No    Safety Is the patient's home free of loose throw rugs in walkways, pet beds, electrical cords, etc?   yes      Grab bars in the bathroom? yes      Walkin shower? no      Shower Seat? no      Handrails on the stairs?   yes      Adequate lighting?   yes  Patient Care Team: Eustaquio Maize, MD as PCP - General (Pediatrics) Gala Romney Cristopher Estimable, MD as Consulting Physician (Gastroenterology)  Hospitalizations, surgeries, and ER visits in previous 12 months No hospitalizations, ER visits, or surgeries this past year.   Objective:    Today's Vitals   02/27/18 1528  BP: 96/68  Pulse: 67  Weight: 173 lb (78.5 kg)  Height: 5' 2.5" (1.588 m)   Body mass index is 31.14 kg/m.  Advanced Directives 02/27/2018 11/09/2017  Does Patient Have a Medical Advance Directive? No No  Would patient like information on creating a medical advance directive? Yes (MAU/Ambulatory/Procedural Areas - Information given) No - Patient declined    Hearing/Vision  No hearing or vision deficits noted during visit.  Cognitive Function: MMSE - Mini Mental State Exam 02/27/2018  Orientation to time 5  Orientation to Place 5  Registration 3  Attention/ Calculation 4  Recall 2  Language- name 2 objects 2  Language- repeat 1  Language- follow 3 step command 3  Language- read & follow direction 1  Write a sentence 1  Copy design 1  Total  score 28       Normal Cognitive Function Screening: Yes    Immunizations and Health Maintenance  There is no immunization history on file for this patient. Health Maintenance Due  Topic Date Due  . MAMMOGRAM  01/02/2018   Health Maintenance  Topic Date Due  . MAMMOGRAM  01/02/2018  . INFLUENZA VACCINE  04/17/2018 (Originally 02/07/2018)  . TETANUS/TDAP  02/05/2019 (Originally 12/20/1966)  . PNA vac Low Risk Adult (1 of 2 - PCV13) 02/05/2019 (Originally 12/19/2012)  . COLONOSCOPY  11/10/2027  . DEXA SCAN  Completed  . Hepatitis C Screening  Completed        Assessment:   This is a routine wellness examination for [place].    Plan:    Goals    . Exercise 150 min/wk Moderate Activity        Health Maintenance Recommendations: Pneumococcal vaccine  Influenza vaccine Td vaccine Screening mammography -patient declined vaccines  Additional Screening Recommendations: Lung: Low Dose CT Chest recommended if Age 90-80 years, 30 pack-year currently smoking OR have quit w/in 15years. Patient does not qualify. Hepatitis C Screening recommended: no   Keep f/u with Eustaquio Maize, MD and any other specialty appointments you may have Continue current medications Move carefully to avoid falls. Use assistive devices like a cane or walker if needed. Aim for at least 150 minutes of moderate activity a week. This can be done with chair exercises if necessary. Read or work on puzzles daily Stay connected with friends and family  I have personally reviewed and noted the following in the patient's chart:   . Medical and social history . Use of alcohol, tobacco or illicit drugs  . Current medications and supplements . Functional ability and status . Nutritional status . Physical activity . Advanced directives . List of other physicians . Hospitalizations, surgeries, and ER visits  in previous 12 months . Vitals . Screenings to include cognitive, depression, and  falls . Referrals and appointments  In addition, I have reviewed and discussed with patient certain preventive protocols, quality metrics, and best practice recommendations. A written personalized care plan for preventive services as well as general preventive health recommendations were provided to patient.     Chong Sicilian, RN   02/27/2018

## 2018-02-27 NOTE — Patient Instructions (Signed)
  Anne Strong , Thank you for taking time to come for your Medicare Wellness Visit. I appreciate your ongoing commitment to your health goals. Please review the following plan we discussed and let me know if I can assist you in the future.   These are the goals we discussed: Goals    . Exercise 150 min/wk Moderate Activity       This is a list of the screening recommended for you and due dates:  Health Maintenance  Topic Date Due  . Mammogram  01/02/2018  . Flu Shot  04/17/2018*  . Tetanus Vaccine  02/05/2019*  . Pneumonia vaccines (1 of 2 - PCV13) 02/05/2019*  . Colon Cancer Screening  11/10/2027  . DEXA scan (bone density measurement)  Completed  .  Hepatitis C: One time screening is recommended by Center for Disease Control  (CDC) for  adults born from [birth year] through 1963.   Completed  *Topic was postponed. The date shown is not the original due date.

## 2018-03-14 ENCOUNTER — Other Ambulatory Visit: Payer: Self-pay | Admitting: Pediatrics

## 2018-03-14 ENCOUNTER — Other Ambulatory Visit: Payer: Self-pay | Admitting: *Deleted

## 2018-03-14 DIAGNOSIS — J4530 Mild persistent asthma, uncomplicated: Secondary | ICD-10-CM

## 2018-03-14 DIAGNOSIS — I10 Essential (primary) hypertension: Secondary | ICD-10-CM

## 2018-05-13 ENCOUNTER — Other Ambulatory Visit: Payer: Self-pay | Admitting: Pediatrics

## 2018-05-13 DIAGNOSIS — I10 Essential (primary) hypertension: Secondary | ICD-10-CM

## 2018-06-11 ENCOUNTER — Other Ambulatory Visit: Payer: Self-pay | Admitting: Pediatrics

## 2018-06-11 DIAGNOSIS — I700 Atherosclerosis of aorta: Secondary | ICD-10-CM

## 2018-06-11 DIAGNOSIS — J4530 Mild persistent asthma, uncomplicated: Secondary | ICD-10-CM

## 2018-06-11 NOTE — Telephone Encounter (Signed)
Last seen 02/04/18  Last lipid 11/08/16

## 2018-08-07 ENCOUNTER — Ambulatory Visit: Payer: Medicare Other | Admitting: Pediatrics

## 2018-08-09 ENCOUNTER — Encounter: Payer: Self-pay | Admitting: Family Medicine

## 2018-08-09 ENCOUNTER — Ambulatory Visit (INDEPENDENT_AMBULATORY_CARE_PROVIDER_SITE_OTHER): Payer: Medicare Other | Admitting: Family Medicine

## 2018-08-09 VITALS — BP 133/83 | HR 69 | Temp 99.0°F | Ht 62.5 in | Wt 175.0 lb

## 2018-08-09 DIAGNOSIS — I700 Atherosclerosis of aorta: Secondary | ICD-10-CM | POA: Diagnosis not present

## 2018-08-09 DIAGNOSIS — I10 Essential (primary) hypertension: Secondary | ICD-10-CM | POA: Diagnosis not present

## 2018-08-09 DIAGNOSIS — M069 Rheumatoid arthritis, unspecified: Secondary | ICD-10-CM

## 2018-08-09 DIAGNOSIS — E538 Deficiency of other specified B group vitamins: Secondary | ICD-10-CM

## 2018-08-09 DIAGNOSIS — J4530 Mild persistent asthma, uncomplicated: Secondary | ICD-10-CM

## 2018-08-09 MED ORDER — NAPROXEN SODIUM 220 MG PO TABS: 220.0000 mg | ORAL_TABLET | Freq: Every day | ORAL | 3 refills | Status: AC | PRN

## 2018-08-09 MED ORDER — BUDESONIDE 0.25 MG/2ML IN SUSP: 0.2500 mg | Freq: Two times a day (BID) | RESPIRATORY_TRACT | 12 refills | Status: DC

## 2018-08-09 MED ORDER — VITAMIN B-12 100 MCG PO TABS: 100.0000 ug | ORAL_TABLET | Freq: Every day | ORAL | 3 refills | Status: AC

## 2018-08-09 MED ORDER — MONTELUKAST SODIUM 10 MG PO TABS: 10.0000 mg | ORAL_TABLET | Freq: Every day | ORAL | 0 refills | Status: DC

## 2018-08-09 MED ORDER — CALCIUM CARBONATE-VITAMIN D 500-200 MG-UNIT PO TABS: 1.0000 | ORAL_TABLET | Freq: Every day | ORAL | 3 refills | Status: DC

## 2018-08-09 MED ORDER — RAMIPRIL 10 MG PO CAPS: 10.0000 mg | ORAL_CAPSULE | Freq: Every day | ORAL | 0 refills | Status: DC

## 2018-08-09 MED ORDER — VITAMIN D 50 MCG (2000 UT) PO CAPS: 2000.0000 [IU] | ORAL_CAPSULE | Freq: Every day | ORAL | 30 refills | Status: AC

## 2018-08-09 MED ORDER — ALBUTEROL SULFATE HFA 108 (90 BASE) MCG/ACT IN AERS: 2.0000 | INHALATION_SPRAY | Freq: Four times a day (QID) | RESPIRATORY_TRACT | 1 refills | Status: DC | PRN

## 2018-08-09 MED ORDER — AMLODIPINE BESYLATE 5 MG PO TABS: 5.0000 mg | ORAL_TABLET | Freq: Every day | ORAL | 1 refills | Status: DC

## 2018-08-09 MED ORDER — METOPROLOL SUCCINATE ER 25 MG PO TB24: 25.0000 mg | ORAL_TABLET | Freq: Every day | ORAL | 1 refills | Status: DC

## 2018-08-09 MED ORDER — ASPIRIN 81 MG PO TABS: 81.0000 mg | ORAL_TABLET | Freq: Every day | ORAL | 3 refills | Status: AC

## 2018-08-09 MED ORDER — VITAMIN C 250 MG PO TABS: 250.0000 mg | ORAL_TABLET | Freq: Every day | ORAL | 3 refills | Status: DC

## 2018-08-09 MED ORDER — PRAVASTATIN SODIUM 40 MG PO TABS: 40.0000 mg | ORAL_TABLET | Freq: Every day | ORAL | 0 refills | Status: DC

## 2018-08-09 NOTE — Patient Instructions (Signed)
DASH Eating Plan  DASH stands for "Dietary Approaches to Stop Hypertension." The DASH eating plan is a healthy eating plan that has been shown to reduce high blood pressure (hypertension). It may also reduce your risk for type 2 diabetes, heart disease, and stroke. The DASH eating plan may also help with weight loss.  What are tips for following this plan?    General guidelines   Avoid eating more than 2,300 mg (milligrams) of salt (sodium) a day. If you have hypertension, you may need to reduce your sodium intake to 1,500 mg a day.   Limit alcohol intake to no more than 1 drink a day for nonpregnant women and 2 drinks a day for men. One drink equals 12 oz of beer, 5 oz of wine, or 1 oz of hard liquor.   Work with your health care provider to maintain a healthy body weight or to lose weight. Ask what an ideal weight is for you.   Get at least 30 minutes of exercise that causes your heart to beat faster (aerobic exercise) most days of the week. Activities may include walking, swimming, or biking.   Work with your health care provider or diet and nutrition specialist (dietitian) to adjust your eating plan to your individual calorie needs.  Reading food labels     Check food labels for the amount of sodium per serving. Choose foods with less than 5 percent of the Daily Value of sodium. Generally, foods with less than 300 mg of sodium per serving fit into this eating plan.   To find whole grains, look for the word "whole" as the first word in the ingredient list.  Shopping   Buy products labeled as "low-sodium" or "no salt added."   Buy fresh foods. Avoid canned foods and premade or frozen meals.  Cooking   Avoid adding salt when cooking. Use salt-free seasonings or herbs instead of table salt or sea salt. Check with your health care provider or pharmacist before using salt substitutes.   Do not fry foods. Cook foods using healthy methods such as baking, boiling, grilling, and broiling instead.   Cook with  heart-healthy oils, such as olive, canola, soybean, or sunflower oil.  Meal planning   Eat a balanced diet that includes:  ? 5 or more servings of fruits and vegetables each day. At each meal, try to fill half of your plate with fruits and vegetables.  ? Up to 6-8 servings of whole grains each day.  ? Less than 6 oz of lean meat, poultry, or fish each day. A 3-oz serving of meat is about the same size as a deck of cards. One egg equals 1 oz.  ? 2 servings of low-fat dairy each day.  ? A serving of nuts, seeds, or beans 5 times each week.  ? Heart-healthy fats. Healthy fats called Omega-3 fatty acids are found in foods such as flaxseeds and coldwater fish, like sardines, salmon, and mackerel.   Limit how much you eat of the following:  ? Canned or prepackaged foods.  ? Food that is high in trans fat, such as fried foods.  ? Food that is high in saturated fat, such as fatty meat.  ? Sweets, desserts, sugary drinks, and other foods with added sugar.  ? Full-fat dairy products.   Do not salt foods before eating.   Try to eat at least 2 vegetarian meals each week.   Eat more home-cooked food and less restaurant, buffet, and fast food.     When eating at a restaurant, ask that your food be prepared with less salt or no salt, if possible.  What foods are recommended?  The items listed may not be a complete list. Talk with your dietitian about what dietary choices are best for you.  Grains  Whole-grain or whole-wheat bread. Whole-grain or whole-wheat pasta. Brown rice. Oatmeal. Quinoa. Bulgur. Whole-grain and low-sodium cereals. Pita bread. Low-fat, low-sodium crackers. Whole-wheat flour tortillas.  Vegetables  Fresh or frozen vegetables (raw, steamed, roasted, or grilled). Low-sodium or reduced-sodium tomato and vegetable juice. Low-sodium or reduced-sodium tomato sauce and tomato paste. Low-sodium or reduced-sodium canned vegetables.  Fruits  All fresh, dried, or frozen fruit. Canned fruit in natural juice (without  added sugar).  Meat and other protein foods  Skinless chicken or turkey. Ground chicken or turkey. Pork with fat trimmed off. Fish and seafood. Egg whites. Dried beans, peas, or lentils. Unsalted nuts, nut butters, and seeds. Unsalted canned beans. Lean cuts of beef with fat trimmed off. Low-sodium, lean deli meat.  Dairy  Low-fat (1%) or fat-free (skim) milk. Fat-free, low-fat, or reduced-fat cheeses. Nonfat, low-sodium ricotta or cottage cheese. Low-fat or nonfat yogurt. Low-fat, low-sodium cheese.  Fats and oils  Soft margarine without trans fats. Vegetable oil. Low-fat, reduced-fat, or light mayonnaise and salad dressings (reduced-sodium). Canola, safflower, olive, soybean, and sunflower oils. Avocado.  Seasoning and other foods  Herbs. Spices. Seasoning mixes without salt. Unsalted popcorn and pretzels. Fat-free sweets.  What foods are not recommended?  The items listed may not be a complete list. Talk with your dietitian about what dietary choices are best for you.  Grains  Baked goods made with fat, such as croissants, muffins, or some breads. Dry pasta or rice meal packs.  Vegetables  Creamed or fried vegetables. Vegetables in a cheese sauce. Regular canned vegetables (not low-sodium or reduced-sodium). Regular canned tomato sauce and paste (not low-sodium or reduced-sodium). Regular tomato and vegetable juice (not low-sodium or reduced-sodium). Pickles. Olives.  Fruits  Canned fruit in a light or heavy syrup. Fried fruit. Fruit in cream or butter sauce.  Meat and other protein foods  Fatty cuts of meat. Ribs. Fried meat. Bacon. Sausage. Bologna and other processed lunch meats. Salami. Fatback. Hotdogs. Bratwurst. Salted nuts and seeds. Canned beans with added salt. Canned or smoked fish. Whole eggs or egg yolks. Chicken or turkey with skin.  Dairy  Whole or 2% milk, cream, and half-and-half. Whole or full-fat cream cheese. Whole-fat or sweetened yogurt. Full-fat cheese. Nondairy creamers. Whipped toppings.  Processed cheese and cheese spreads.  Fats and oils  Butter. Stick margarine. Lard. Shortening. Ghee. Bacon fat. Tropical oils, such as coconut, palm kernel, or palm oil.  Seasoning and other foods  Salted popcorn and pretzels. Onion salt, garlic salt, seasoned salt, table salt, and sea salt. Worcestershire sauce. Tartar sauce. Barbecue sauce. Teriyaki sauce. Soy sauce, including reduced-sodium. Steak sauce. Canned and packaged gravies. Fish sauce. Oyster sauce. Cocktail sauce. Horseradish that you find on the shelf. Ketchup. Mustard. Meat flavorings and tenderizers. Bouillon cubes. Hot sauce and Tabasco sauce. Premade or packaged marinades. Premade or packaged taco seasonings. Relishes. Regular salad dressings.  Where to find more information:   National Heart, Lung, and Blood Institute: www.nhlbi.nih.gov   American Heart Association: www.heart.org  Summary   The DASH eating plan is a healthy eating plan that has been shown to reduce high blood pressure (hypertension). It may also reduce your risk for type 2 diabetes, heart disease, and stroke.   With the   DASH eating plan, you should limit salt (sodium) intake to 2,300 mg a day. If you have hypertension, you may need to reduce your sodium intake to 1,500 mg a day.   When on the DASH eating plan, aim to eat more fresh fruits and vegetables, whole grains, lean proteins, low-fat dairy, and heart-healthy fats.   Work with your health care provider or diet and nutrition specialist (dietitian) to adjust your eating plan to your individual calorie needs.  This information is not intended to replace advice given to you by your health care provider. Make sure you discuss any questions you have with your health care provider.  Document Released: 06/15/2011 Document Revised: 06/19/2016 Document Reviewed: 06/19/2016  Elsevier Interactive Patient Education  2019 [employer] Inc[employer]

## 2018-08-10 ENCOUNTER — Encounter: Payer: Self-pay | Admitting: Family Medicine

## 2018-08-10 DIAGNOSIS — N183 Chronic kidney disease, stage 3 unspecified: Secondary | ICD-10-CM | POA: Insufficient documentation

## 2018-08-10 DIAGNOSIS — I129 Hypertensive chronic kidney disease with stage 1 through stage 4 chronic kidney disease, or unspecified chronic kidney disease: Secondary | ICD-10-CM

## 2018-08-10 LAB — CMP14+EGFR
ALBUMIN: 3.6 g/dL — AB (ref 3.8–4.8)
ALK PHOS: 54 IU/L (ref 39–117)
ALT: 9 IU/L (ref 0–32)
AST: 11 IU/L (ref 0–40)
Albumin/Globulin Ratio: 1.2 (ref 1.2–2.2)
BUN / CREAT RATIO: 17 (ref 12–28)
BUN: 23 mg/dL (ref 8–27)
Bilirubin Total: <0.2 mg/dL (ref 0.0–1.2)
CALCIUM: 9.3 mg/dL (ref 8.7–10.3)
CO2: 24 mmol/L (ref 20–29)
CREATININE: 1.39 mg/dL — AB (ref 0.57–1.00)
Chloride: 103 mmol/L (ref 96–106)
GFR calc Af Amer: 44 mL/min/{1.73_m2} — ABNORMAL LOW (ref >59)
GFR, EST NON AFRICAN AMERICAN: 38 mL/min/{1.73_m2} — AB (ref >59)
GLOBULIN, TOTAL: 3.1 g/dL (ref 1.5–4.5)
GLUCOSE: 86 mg/dL (ref 65–99)
Potassium: 4.6 mmol/L (ref 3.5–5.2)
Sodium: 141 mmol/L (ref 134–144)
TOTAL PROTEIN: 6.7 g/dL (ref 6.0–8.5)

## 2018-08-10 LAB — CBC WITH DIFFERENTIAL/PLATELET
BASOS ABS: 0 10*3/uL (ref 0.0–0.2)
Basos: 1 %
EOS (ABSOLUTE): 0.2 10*3/uL (ref 0.0–0.4)
EOS: 4 %
HEMOGLOBIN: 11.1 g/dL (ref 11.1–15.9)
Hematocrit: 33.3 % — ABNORMAL LOW (ref 34.0–46.6)
IMMATURE GRANULOCYTES: 0 %
Immature Grans (Abs): 0 10*3/uL (ref 0.0–0.1)
LYMPHS ABS: 1 10*3/uL (ref 0.7–3.1)
LYMPHS: 22 %
MCH: 29.1 pg (ref 26.6–33.0)
MCHC: 33.3 g/dL (ref 31.5–35.7)
MCV: 87 fL (ref 79–97)
Monocytes Absolute: 0.5 10*3/uL (ref 0.1–0.9)
Monocytes: 10 %
NEUTROS PCT: 63 %
Neutrophils Absolute: 2.9 10*3/uL (ref 1.4–7.0)
Platelets: 204 10*3/uL (ref 150–450)
RBC: 3.81 x10E6/uL (ref 3.77–5.28)
RDW: 13 % (ref 11.7–15.4)
WBC: 4.7 10*3/uL (ref 3.4–10.8)

## 2018-08-10 LAB — MICROALBUMIN / CREATININE URINE RATIO
Creatinine, Urine: 153.4 mg/dL
MICROALB/CREAT RATIO: 3 mg/g{creat} (ref 0–29)
MICROALBUM., U, RANDOM: 4.6 ug/mL

## 2018-08-10 LAB — TSH: TSH: 0.791 u[IU]/mL (ref 0.450–4.500)

## 2018-08-10 LAB — LIPASE: LIPASE: 20 U/L (ref 14–72)

## 2018-08-10 LAB — VITAMIN B12

## 2018-08-10 NOTE — Progress Notes (Signed)
Subjective:    Patient ID: Anne Strong, female    DOB: Jul 27, 1945, 73 y.o.   MRN: [id number]  Chief Complaint:  Medical Management of Chronic Issues   HPI: Anne Strong is a 73 y.o. female presenting on 08/09/2018 for Medical Management of Chronic Issues   1. Mild persistent asthma without complication  Has been dong well on current medications. Recently switched to budesonide from flovent due to cost. States she feels the budesonide is working better. She denies nighttime awakenings. States she had minimal limitation to her daily activities and may have to use her rescue inhaler 1-2 times per weeks. States she feels good overall.     2. Essential hypertension  Complaint with meds - Yes Checking BP at home - No Exercising Regularly - Yes Watching Salt intake - Yes Pertinent ROS:  Headache - No Chest pain - No Dyspnea - Intermittent, has asthma Palpitations - No LE edema - No They report good compliance with medications and can restate their regimen by memory. No medication side effects.  BP Readings from Last 3 Encounters:  08/09/18 133/83  02/27/18 96/68  02/04/18 113/77     3. Vitamin B 12 deficiency  Denies fatigue, numbness, tingling, glossitis, impaired memory, or changes in mood. States she is taking her supplements daily without adverse side effects.    4. Rheumatoid arthritis, involving unspecified site, unspecified rheumatoid factor presence ([place])  She reports minor aches and pains. States her symptoms are well controlled with Aleve. She tries to stay active and exercise on a regular basis. States the exercise helps more than anything.      Relevant past medical, surgical, family, and social history reviewed and updated as indicated.  Allergies and medications reviewed and updated.   Past Medical History:  Diagnosis Date  . AAA (abdominal aortic aneurysm) ([place])   . Anemia   . Anxiety   . Arthritis   . Asthma   . Diabetes mellitus without complication  ([place])   . Hyperlipidemia   . Hypertension     Past Surgical History:  Procedure Laterality Date  . ABDOMINAL HYSTERECTOMY    . APPENDECTOMY    . BUNIONECTOMY    . COLONOSCOPY N/A 11/09/2017   Procedure: COLONOSCOPY;  Surgeon: Danie Binder, MD;  Location: [facility];  Service: Endoscopy;  Laterality: N/A;  2:00  . GANGLION CYST EXCISION      Social History   Socioeconomic History  . Marital status: Divorced    Spouse name: Not on file  . Number of children: 2  . Years of education: 18  . Highest education level: 12th grade  Occupational History  . Occupation: Retired    Comment: Lexicographer  . Financial resource strain: Not hard at all  . Food insecurity:    Worry: Never true    Inability: Never true  . Transportation needs:    Medical: No    Non-medical: No  Tobacco Use  . Smoking status: Former Smoker    Types: Cigarettes    Last attempt to quit: 08/04/2012    Years since quitting: 6.0  . Smokeless tobacco: Never Used  Substance and Sexual Activity  . Alcohol use: No  . Drug use: No  . Sexual activity: Not Currently  Lifestyle  . Physical activity:    Days per week: 3 days    Minutes per session: 60 min  . Stress: Not at all  Relationships  . Social connections:  Talks on phone: More than three times a week    Gets together: More than three times a week    Attends religious service: More than 4 times per year    Active member of club or organization: Yes    Attends meetings of clubs or organizations: More than 4 times per year    Relationship status: Divorced  . Intimate partner violence:    Fear of current or ex partner: No    Emotionally abused: No    Physically abused: No    Forced sexual activity: No  Other Topics Concern  . Not on file  Social History Narrative  . Not on file    Outpatient Encounter Medications as of 08/09/2018  Medication Sig  . albuterol (PROVENTIL HFA;VENTOLIN HFA) 108 (90 Base) MCG/ACT inhaler Inhale 2 puffs  into the lungs every 6 (six) hours as needed for wheezing or shortness of breath.  Marland Kitchen amLODipine (NORVASC) 5 MG tablet Take 1 tablet (5 mg total) by mouth daily.  Marland Kitchen aspirin 81 MG tablet Take 1 tablet (81 mg total) by mouth daily for 30 days.  . calcium-vitamin D (OSCAL WITH D) 500-200 MG-UNIT tablet Take 1 tablet by mouth daily for 30 days.  . Cholecalciferol (VITAMIN D) 50 MCG (2000 UT) CAPS Take 1 capsule (2,000 Units total) by mouth daily for 30 days.  . fluticasone (FLONASE) 50 MCG/ACT nasal spray Place 2 sprays into both nostrils daily.  . metoprolol succinate (TOPROL-XL) 25 MG 24 hr tablet Take 1 tablet (25 mg total) by mouth daily.  . montelukast (SINGULAIR) 10 MG tablet Take 1 tablet (10 mg total) by mouth at bedtime.  . naproxen sodium (ALEVE) 220 MG tablet Take 1 tablet (220 mg total) by mouth daily as needed for up to 30 days (for pain or headache).  Vladimir Faster Glycol-Propyl Glycol (SYSTANE OP) Place 2 drops into both eyes daily as needed (for dry eyes).  . pravastatin (PRAVACHOL) 40 MG tablet Take 1 tablet (40 mg total) by mouth daily.  . ramipril (ALTACE) 10 MG capsule Take 1 capsule (10 mg total) by mouth daily.  . vitamin B-12 (CYANOCOBALAMIN) 100 MCG tablet Take 1 tablet (100 mcg total) by mouth daily for 30 days.  . vitamin C (ASCORBIC ACID) 250 MG tablet Take 1 tablet (250 mg total) by mouth daily for 30 days.  . [DISCONTINUED] albuterol (PROVENTIL HFA;VENTOLIN HFA) 108 (90 Base) MCG/ACT inhaler Inhale 2 puffs into the lungs every 6 (six) hours as needed for wheezing or shortness of breath.  . [DISCONTINUED] amLODipine (NORVASC) 5 MG tablet Take 1 tablet (5 mg total) by mouth daily.  . [DISCONTINUED] aspirin 81 MG tablet Take 81 mg by mouth daily.  . [DISCONTINUED] calcium-vitamin D (OSCAL WITH D) 500-200 MG-UNIT tablet Take 1 tablet by mouth daily.   . [DISCONTINUED] Cholecalciferol (VITAMIN D) 2000 units CAPS Take 2,000 Units by mouth daily.   . [DISCONTINUED] fluticasone  (FLOVENT HFA) 110 MCG/ACT inhaler Inhale 2 puffs into the lungs 2 (two) times daily.  . [DISCONTINUED] metoprolol succinate (TOPROL-XL) 25 MG 24 hr tablet Take 1 tablet (25 mg total) by mouth daily.  . [DISCONTINUED] montelukast (SINGULAIR) 10 MG tablet Take 1 tablet (10 mg total) by mouth at bedtime.  . [DISCONTINUED] naproxen sodium (ALEVE) 220 MG tablet Take 220 mg by mouth daily as needed (for pain or headache).   . [DISCONTINUED] pravastatin (PRAVACHOL) 40 MG tablet Take 1 tablet (40 mg total) by mouth daily.  . [DISCONTINUED] ramipril (ALTACE) 10  MG capsule Take 1 capsule (10 mg total) by mouth daily.  . [DISCONTINUED] vitamin B-12 (CYANOCOBALAMIN) 100 MCG tablet Take 100 mcg by mouth daily.  . [DISCONTINUED] vitamin C (ASCORBIC ACID) 250 MG tablet Take 250 mg by mouth daily.  . budesonide (PULMICORT) 0.25 MG/2ML nebulizer solution Take 2 mLs (0.25 mg total) by nebulization 2 (two) times daily.   No facility-administered encounter medications on file as of 08/09/2018.     Allergies  Allergen Reactions  . Penicillins Anaphylaxis and Other (See Comments)    Has patient had a PCN reaction causing immediate rash, facial/tongue/throat swelling, SOB or lightheadedness with hypotension: Yes Has patient had a PCN reaction causing severe rash involving mucus membranes or skin necrosis: No Has patient had a PCN reaction that required hospitalization: Yes - In hospital Has patient had a PCN reaction occurring within the last 10 years: No If all of the above answers are "NO", then may proceed with Cephalosporin use.   . Codeine Itching and Other (See Comments)    [place]    Review of Systems  Constitutional: Negative for chills, fatigue and fever.  Eyes: Negative for photophobia and visual disturbance.  Respiratory: Positive for shortness of breath (intermittent) and wheezing (intermittent). Negative for apnea, cough, choking, chest tightness and stridor.   Cardiovascular: Negative for chest  pain, palpitations and leg swelling.  Gastrointestinal: Negative for abdominal distention, abdominal pain, anal bleeding, blood in stool, constipation, diarrhea, nausea, rectal pain and vomiting.  Endocrine: Negative for polydipsia, polyphagia and polyuria.  Genitourinary: Negative for decreased urine volume and difficulty urinating.  Musculoskeletal: Positive for arthralgias and joint swelling. Negative for gait problem, myalgias, neck pain and neck stiffness.  Skin: Negative for color change, pallor, rash and wound.  Neurological: Negative for dizziness, tremors, seizures, syncope, facial asymmetry, speech difficulty, weakness, light-headedness, numbness and headaches.  Hematological: Negative for adenopathy. Does not bruise/bleed easily.  Psychiatric/Behavioral: Negative for confusion.  All other systems reviewed and are negative.       Objective:    BP 133/83   Pulse 69   Temp 99 F (37.2 C) (Oral)   Ht 5' 2.5" (1.588 m)   Wt 175 lb (79.4 kg)   BMI 31.50 kg/m    Wt Readings from Last 3 Encounters:  08/09/18 175 lb (79.4 kg)  02/27/18 173 lb (78.5 kg)  02/04/18 174 lb 6.4 oz (79.1 kg)    Physical Exam Vitals signs and nursing note reviewed.  Constitutional:      General: She is not in acute distress.    Appearance: Normal appearance. She is well-developed, well-groomed and overweight. She is not ill-appearing or toxic-appearing.  HENT:     Head: Normocephalic and atraumatic.     Right Ear: Tympanic membrane, ear canal and external ear normal.     Left Ear: Tympanic membrane, ear canal and external ear normal.     Nose: Nose normal.     Mouth/Throat:     Lips: Pink.     Mouth: Mucous membranes are moist.     Pharynx: Oropharynx is clear.  Eyes:     General: Lids are normal.     Extraocular Movements: Extraocular movements intact.     Conjunctiva/sclera: Conjunctivae normal.     Pupils: Pupils are equal, round, and reactive to light.  Neck:     Musculoskeletal:  Normal range of motion and neck supple.     Thyroid: No thyroid mass, thyromegaly or thyroid tenderness.     Vascular: No carotid bruit or  JVD.     Trachea: Trachea and phonation normal.  Cardiovascular:     Rate and Rhythm: Normal rate and regular rhythm.     Heart sounds: Normal heart sounds. No murmur. No friction rub. No gallop.   Pulmonary:     Effort: Pulmonary effort is normal. No respiratory distress.     Breath sounds: Normal breath sounds. No wheezing.  Abdominal:     General: Bowel sounds are normal.     Palpations: Abdomen is soft.     Tenderness: There is no abdominal tenderness. There is no right CVA tenderness or left CVA tenderness.  Musculoskeletal:     Comments: Minimal swelling of MCP and PIP joints   Lymphadenopathy:     Cervical: No cervical adenopathy.  Skin:    General: Skin is warm and dry.     Capillary Refill: Capillary refill takes less than 2 seconds.  Neurological:     General: No focal deficit present.     Mental Status: She is alert and oriented to person, place, and time.     Cranial Nerves: Cranial nerves are intact.     Sensory: Sensation is intact.     Motor: Motor function is intact.     Coordination: Coordination is intact.     Gait: Gait is intact.     Deep Tendon Reflexes: Reflexes are normal and symmetric.  Psychiatric:        Attention and Perception: Attention and perception normal.        Mood and Affect: Mood and affect normal.        Speech: Speech normal.        Behavior: Behavior normal. Behavior is cooperative.        Thought Content: Thought content normal.        Cognition and Memory: Cognition and memory normal.        Judgment: Judgment normal.     Results for orders placed or performed in visit on 08/09/18  CBC with Differential/Platelet  Result Value Ref Range   WBC 4.7 3.4 - 10.8 x10E3/uL   RBC 3.81 3.77 - 5.28 x10E6/uL   Hemoglobin 11.1 11.1 - 15.9 g/dL   Hematocrit 33.3 (L) 34.0 - 46.6 %   MCV 87 79 - 97 fL    MCH 29.1 26.6 - 33.0 pg   MCHC 33.3 31.5 - 35.7 g/dL   RDW 13.0 11.7 - 15.4 %   Platelets 204 150 - 450 x10E3/uL   Neutrophils 63 Not Estab. %   Lymphs 22 Not Estab. %   Monocytes 10 Not Estab. %   Eos 4 Not Estab. %   Basos 1 Not Estab. %   Neutrophils Absolute 2.9 1.4 - 7.0 x10E3/uL   Lymphocytes Absolute 1.0 0.7 - 3.1 x10E3/uL   Monocytes Absolute 0.5 0.1 - 0.9 x10E3/uL   EOS (ABSOLUTE) 0.2 0.0 - 0.4 x10E3/uL   Basophils Absolute 0.0 0.0 - 0.2 x10E3/uL   Immature Granulocytes 0 Not Estab. %   Immature Grans (Abs) 0.0 0.0 - 0.1 x10E3/uL  CMP14+EGFR  Result Value Ref Range   Glucose 86 65 - 99 mg/dL   BUN 23 8 - 27 mg/dL   Creatinine, Ser 1.39 (H) 0.57 - 1.00 mg/dL   GFR calc non Af Amer 38 (L) >59 mL/min/1.73   GFR calc Af Amer 44 (L) >59 mL/min/1.73   BUN/Creatinine Ratio 17 12 - 28   Sodium 141 134 - 144 mmol/L   Potassium 4.6 3.5 - 5.2 mmol/L  Chloride 103 96 - 106 mmol/L   CO2 24 20 - 29 mmol/L   Calcium 9.3 8.7 - 10.3 mg/dL   Total Protein 6.7 6.0 - 8.5 g/dL   Albumin 3.6 (L) 3.8 - 4.8 g/dL   Globulin, Total 3.1 1.5 - 4.5 g/dL   Albumin/Globulin Ratio 1.2 1.2 - 2.2   Bilirubin Total <0.2 0.0 - 1.2 mg/dL   Alkaline Phosphatase 54 39 - 117 IU/L   AST 11 0 - 40 IU/L   ALT 9 0 - 32 IU/L  Lipase  Result Value Ref Range   Lipase 20 14 - 72 U/L  TSH  Result Value Ref Range   TSH 0.791 0.450 - 4.500 uIU/mL  Vitamin B12  Result Value Ref Range   Vitamin B-12 >2000 (H) 232 - 1245 pg/mL       Pertinent labs & imaging results that were available during my care of the patient were reviewed by me and considered in my medical decision making.  Assessment & Plan:  Anne Strong was seen today for medical management of chronic issues.  Diagnoses and all orders for this visit:  Mild persistent asthma without complication Well controlled with current medications. Recently switched to budesonide from flovent due to cost. Tolerating budesonide well. Will continue with budesonide.  Continue medications as prescribed and report any new or worsening symptoms.  -     budesonide (PULMICORT) 0.25 MG/2ML nebulizer solution; Take 2 mLs (0.25 mg total) by nebulization 2 (two) times daily. -     albuterol (PROVENTIL HFA;VENTOLIN HFA) 108 (90 Base) MCG/ACT inhaler; Inhale 2 puffs into the lungs every 6 (six) hours as needed for wheezing or shortness of breath. -     montelukast (SINGULAIR) 10 MG tablet; Take 1 tablet (10 mg total) by mouth at bedtime.  Essential hypertension Well controlled with current regimen. Labs pending. Continue medications as prescribed.  -     CBC with Differential/Platelet -     CMP14+EGFR -     Lipase -     TSH -     Microalbumin / creatinine urine ratio -     metoprolol succinate (TOPROL-XL) 25 MG 24 hr tablet; Take 1 tablet (25 mg total) by mouth daily. -     amLODipine (NORVASC) 5 MG tablet; Take 1 tablet (5 mg total) by mouth daily. -     aspirin 81 MG tablet; Take 1 tablet (81 mg total) by mouth daily for 30 days. -     ramipril (ALTACE) 10 MG capsule; Take 1 capsule (10 mg total) by mouth daily.  Vitamin B 12 deficiency Labs pending. Continue medications as prescribed.  -     CBC with Differential/Platelet -     Vitamin B12  Aortic atherosclerosis ([place]) Labs pending. Continue medications as prescribed.  -     CMP14+EGFR -     Lipase -     aspirin 81 MG tablet; Take 1 tablet (81 mg total) by mouth daily for 30 days. -     pravastatin (PRAVACHOL) 40 MG tablet; Take 1 tablet (40 mg total) by mouth daily.  Rheumatoid arthritis, involving unspecified site, unspecified rheumatoid factor presence ([place]) Stable, continue medications as prescribed.  -     naproxen sodium (ALEVE) 220 MG tablet; Take 1 tablet (220 mg total) by mouth daily as needed for up to 30 days (for pain or headache). -     calcium-vitamin D (OSCAL WITH D) 500-200 MG-UNIT tablet; Take 1 tablet by mouth daily for 30 days. -  Cholecalciferol (VITAMIN D) 50 MCG (2000 UT) CAPS;  Take 1 capsule (2,000 Units total) by mouth daily for 30 days. -     vitamin B-12 (CYANOCOBALAMIN) 100 MCG tablet; Take 1 tablet (100 mcg total) by mouth daily for 30 days. -     vitamin C (ASCORBIC ACID) 250 MG tablet; Take 1 tablet (250 mg total) by mouth daily for 30 days.     Continue all other maintenance medications.  Follow up plan: Return in about 3 months (around 11/07/2018), or if symptoms worsen or fail to improve.  Educational handout given for DASH diet  The above assessment and management plan was discussed with the patient. The patient verbalized understanding of and has agreed to the management plan. Patient is aware to call the clinic if symptoms persist or worsen. Patient is aware when to return to the clinic for a follow-up visit. Patient educated on when it is appropriate to go to the emergency department.   Monia Pouch, FNP-C [facility] [phone]

## 2018-09-02 ENCOUNTER — Ambulatory Visit (INDEPENDENT_AMBULATORY_CARE_PROVIDER_SITE_OTHER): Payer: Medicare Other | Admitting: Family

## 2018-09-02 ENCOUNTER — Encounter: Payer: Self-pay | Admitting: Family

## 2018-09-02 VITALS — BP 116/77 | HR 95 | Temp 98.6°F | Ht 62.5 in | Wt 175.2 lb

## 2018-09-02 DIAGNOSIS — J4531 Mild persistent asthma with (acute) exacerbation: Secondary | ICD-10-CM

## 2018-09-02 MED ORDER — METHYLPREDNISOLONE ACETATE 80 MG/ML IJ SUSP
80.0000 mg | Freq: Once | INTRAMUSCULAR | Status: AC
  Administered 2018-09-02: 80 mg via INTRAMUSCULAR

## 2018-09-02 MED ORDER — BENZONATATE 200 MG PO CAPS: 200.0000 mg | ORAL_CAPSULE | Freq: Three times a day (TID) | ORAL | 1 refills | Status: DC | PRN

## 2018-09-02 MED ORDER — PREDNISONE 10 MG (21) PO TBPK: ORAL_TABLET | ORAL | 0 refills | Status: DC

## 2018-09-02 MED ORDER — BUDESONIDE-FORMOTEROL FUMARATE 80-4.5 MCG/ACT IN AERO: 2.0000 | INHALATION_SPRAY | Freq: Two times a day (BID) | RESPIRATORY_TRACT | 3 refills | Status: DC

## 2018-09-02 NOTE — Patient Instructions (Signed)
Bronchospasm, Adult  Bronchospasm is a tightening of the airways going into the lungs. During an episode, it may be harder to breathe. You may cough, and you may make a whistling sound when you breathe (wheeze). This condition often affects people with asthma. What are the causes? This condition is caused by swelling and irritation in the airways. It can be triggered by:  An infection (common).  Seasonal allergies.  An allergic reaction.  Exercise.  Irritants. These include pollution, cigarette smoke, strong odors, aerosol sprays, and paint fumes.  Weather changes. Winds increase molds and pollens in the air. Cold air may cause swelling.  Stress and emotional upset. What are the signs or symptoms? Symptoms of this condition include:  Wheezing. If the episode was triggered by an allergy, wheezing may start right away or hours later.  Nighttime coughing.  Frequent or severe coughing with a simple cold.  Chest tightness.  Shortness of breath.  Decreased ability to exercise. How is this diagnosed? This condition is usually diagnosed with a review of your medical history and a physical exam. Tests, such as lung function tests, are sometimes done to look for other conditions. The need for a chest X-ray depends on where the wheezing occurs and whether it is the first time you have wheezed. How is this treated? This condition may be treated with:  Inhaled medicines. These open up the airways and help you breathe. They can be taken with an inhaler or a nebulizer device.  Corticosteroid medicines. These may be given for severe bronchospasm, usually when it is associated with asthma.  Avoiding triggers, such as irritants, infection, or allergies. Follow these instructions at home: Medicines  Take over-the-counter and prescription medicines only as told by your health care provider.  If you need to use an inhaler or nebulizer to take your medicine, ask your health care provider  to explain how to use it correctly. If you were given a spacer, always use it with your inhaler. Lifestyle  Reduce the number of triggers in your home. To do this: ? Change your heating and air conditioning filter at least once a month. ? Limit your use of fireplaces and wood stoves. ? Do not smoke. Do not allow smoking in your home. ? Avoid using perfumes and fragrances. ? Get rid of pests, such as roaches and mice, and their droppings. ? Remove any mold from your home. ? Keep your house clean and dust free. Use unscented cleaning products. ? Replace carpet with wood, tile, or vinyl flooring. Carpet can trap dander and dust. ? Use allergy-proof pillows, mattress covers, and box spring covers. ? Wash bed sheets and blankets every week in hot water. Dry them in a dryer. ? Use blankets that are made of polyester or cotton. ? Wash your hands often. ? Do not allow pets in your bedroom.  Avoid breathing in cold air when you exercise. General instructions  Have a plan for seeking medical care. Know when to call your health care provider and local emergency services, and where to get emergency care.  Stay up to date on your immunizations.  When you have an episode of bronchospasm, stay calm. Try to relax and breathe more slowly.  If you have asthma, make sure you have an asthma action plan.  Keep all follow-up visits as told by your health care provider. This is important. Contact a health care provider if:  You have muscle aches.  You have chest pain.  The mucus that you cough up (  If you have asthma, make sure you have an asthma action plan.  · Keep all follow-up visits as told by your health care provider. This is important.  Contact a health care provider if:  · You have muscle aches.  · You have chest pain.  · The mucus that you cough up (sputum) changes from clear or white to yellow, green, gray, or bloody.  · You have a fever.  · Your sputum gets thicker.  Get help right away if:  · Your wheezing and coughing get worse, even after you take your prescribed medicines.  · It gets even harder to breathe.  · You develop severe chest pain.  Summary  · Bronchospasm is a tightening of the airways going into the lungs.  · During an episode of  bronchospasm, you may have a harder time breathing. You may cough and make a whistling sound when you breathe (wheeze).  · Avoid exposure to triggers such as smoke, dust, mold, animal dander, and fragrances.  · When you have an episode of bronchospasm, stay calm. Try to relax and breathe more slowly.  This information is not intended to replace advice given to you by your health care provider. Make sure you discuss any questions you have with your health care provider.  Document Released: 06/29/2003 Document Revised: 06/22/2016 Document Reviewed: 06/22/2016  Elsevier Interactive Patient Education © 2019 Elsevier Inc.

## 2018-09-02 NOTE — Progress Notes (Signed)
Subjective:    Patient ID: Anne Strong, female    DOB: [birth year], 73 y.o.   MRN: [id number]  Chief Complaint  Patient presents with  . cough and congestion with wheezing    Cough  This is a new problem. The current episode started 1 to 4 weeks ago. The problem has been rapidly worsening. The problem occurs every few minutes. The cough is non-productive. Associated symptoms include chills, headaches, myalgias ("from coughing"), nasal congestion, postnasal drip, rhinorrhea, a sore throat, shortness of breath and wheezing. Pertinent negatives include no ear congestion, ear pain or fever. The symptoms are aggravated by lying down. She has tried rest and OTC cough suppressant for the symptoms. The treatment provided mild relief.      Review of Systems  Constitutional: Positive for chills. Negative for fever.  HENT: Positive for postnasal drip, rhinorrhea and sore throat. Negative for ear pain.   Respiratory: Positive for cough, shortness of breath and wheezing.   Musculoskeletal: Positive for myalgias ("from coughing").  Neurological: Positive for headaches.  All other systems reviewed and are negative.      Objective:   Physical Exam Vitals signs reviewed.  Constitutional:      General: She is not in acute distress.    Appearance: She is well-developed.  HENT:     Head: Normocephalic and atraumatic.     Nose: Mucosal edema present.     Mouth/Throat:     Pharynx: Posterior oropharyngeal erythema present.  Eyes:     Pupils: Pupils are equal, round, and reactive to light.  Neck:     Musculoskeletal: Normal range of motion and neck supple.     Thyroid: No thyromegaly.  Cardiovascular:     Rate and Rhythm: Normal rate and regular rhythm.     Heart sounds: Normal heart sounds. No murmur.  Pulmonary:     Effort: Pulmonary effort is normal. No respiratory distress.     Breath sounds: Wheezing present.     Comments: Constant nonproductive, tight wheezing cough Abdominal:   General: Bowel sounds are normal. There is no distension.     Palpations: Abdomen is soft.     Tenderness: There is no abdominal tenderness.  Musculoskeletal: Normal range of motion.        General: No tenderness.  Skin:    General: Skin is warm and dry.  Neurological:     Mental Status: She is alert and oriented to person, place, and time.     Cranial Nerves: No cranial nerve deficit.     Deep Tendon Reflexes: Reflexes are normal and symmetric.  Psychiatric:        Behavior: Behavior normal.        Thought Content: Thought content normal.        Judgment: Judgment normal.       BP 116/77   Pulse 95   Temp 98.6 F (37 C)   Ht 5' 2.5" (1.588 m)   Wt 175 lb 3.2 oz (79.5 kg)   SpO2 96%   BMI 31.53 kg/m      Assessment & Plan:  Anne Strong comes in today with chief complaint of cough and congestion with wheezing   Diagnosis and orders addressed:  1. Mild persistent asthma with acute exacerbation Pt states she can not take Symbicort because it does not work for her Continue Singulair, flonase daily Tessalon as needed Avoid triggers  RTO in 1 week to recheck  - methylPREDNISolone acetate (DEPO-MEDROL) injection 80 mg - benzonatate (TESSALON)  200 MG capsule; Take 1 capsule (200 mg total) by mouth 3 (three) times daily as needed.  Dispense: 30 capsule; Refill: [place], FNP

## 2018-09-09 ENCOUNTER — Encounter: Payer: Self-pay | Admitting: Family Medicine

## 2018-09-09 ENCOUNTER — Ambulatory Visit (INDEPENDENT_AMBULATORY_CARE_PROVIDER_SITE_OTHER): Payer: Medicare Other | Admitting: Family Medicine

## 2018-09-09 VITALS — BP 125/80 | HR 75 | Temp 97.5°F | Ht 62.5 in | Wt 175.0 lb

## 2018-09-09 DIAGNOSIS — J4530 Mild persistent asthma, uncomplicated: Secondary | ICD-10-CM | POA: Diagnosis not present

## 2018-09-09 NOTE — Patient Instructions (Signed)
Asthma, Adult    Asthma is a long-term (chronic) condition in which the airways get tight and narrow. The airways are the breathing passages that lead from the nose and mouth down into the lungs. A person with asthma will have times when symptoms get worse. These are called asthma attacks. They can cause coughing, whistling sounds when you breathe (wheezing), shortness of breath, and chest pain. They can make it hard to breathe. There is no cure for asthma, but medicines and lifestyle changes can help control it.  There are many things that can bring on an asthma attack or make asthma symptoms worse (triggers). Common triggers include:  · Mold.  · Dust.  · Cigarette smoke.  · Cockroaches.  · Things that can cause allergy symptoms (allergens). These include animal skin flakes (dander) and pollen from trees or grass.  · Things that pollute the air. These may include household cleaners, wood smoke, smog, or chemical odors.  · Cold air, weather changes, and wind.  · Crying or laughing hard.  · Stress.  · Certain medicines or drugs.  · Certain foods such as dried fruit, potato chips, and grape juice.  · Infections, such as a cold or the flu.  · Certain medical conditions or diseases.  · Exercise or tiring activities.  Asthma may be treated with medicines and by staying away from the things that cause asthma attacks. Types of medicines may include:  · Controller medicines. These help prevent asthma symptoms. They are usually taken every day.  · Fast-acting reliever or rescue medicines. These quickly relieve asthma symptoms. They are used as needed and provide short-term relief.  · Allergy medicines if your attacks are brought on by allergens.  · Medicines to help control the body's defense (immune) system.  Follow these instructions at home:  Avoiding triggers in your home  · Change your heating and air conditioning filter often.  · Limit your use of fireplaces and wood stoves.  · Get rid of pests (such as roaches and  mice) and their droppings.  · Throw away plants if you see mold on them.  · Clean your floors. Dust regularly. Use cleaning products that do not smell.  · Have someone vacuum when you are not home. Use a vacuum cleaner with a HEPA filter if possible.  · Replace carpet with wood, tile, or vinyl flooring. Carpet can trap animal skin flakes and dust.  · Use allergy-proof pillows, mattress covers, and box spring covers.  · Wash bed sheets and blankets every week in hot water. Dry them in a dryer.  · Keep your bedroom free of any triggers.   · Avoid pets and keep windows closed when things that cause allergy symptoms are in the air.  · Use blankets that are made of polyester or cotton.  · Clean bathrooms and kitchens with bleach. If possible, have someone repaint the walls in these rooms with mold-resistant paint. Keep out of the rooms that are being cleaned and painted.  · Wash your hands often with soap and water. If soap and water are not available, use hand sanitizer.  · Do not allow anyone to smoke in your home.  General instructions  · Take over-the-counter and prescription medicines only as told by your doctor.  ? Talk with your doctor if you have questions about how or when to take your medicines.  ? Make note if you need to use your medicines more often than usual.  · Do not use any products that   contain nicotine or tobacco, such as cigarettes and e-cigarettes. If you need help quitting, ask your doctor.  · Stay away from secondhand smoke.  · Avoid doing things outdoors when allergen counts are high and when air quality is low.  · Wear a ski mask when doing outdoor activities in the winter. The mask should cover your nose and mouth. Exercise indoors on cold days if you can.  · Warm up before you exercise. Take time to cool down after exercise.  · Use a peak flow meter as told by your doctor. A peak flow meter is a tool that measures how well the lungs are working.  · Keep track of the peak flow meter's readings.  Write them down.  · Follow your asthma action plan. This is a written plan for taking care of your asthma and treating your attacks.  · Make sure you get all the shots (vaccines) that your doctor recommends. Ask your doctor about a flu shot and a pneumonia shot.  · Keep all follow-up visits as told by your doctor. This is important.  Contact a doctor if:  · You have wheezing, shortness of breath, or a cough even while taking medicine to prevent attacks.  · The mucus you cough up (sputum) is thicker than usual.  · The mucus you cough up changes from clear or white to yellow, green, gray, or bloody.  · You have problems from the medicine you are taking, such as:  ? A rash.  ? Itching.  ? Swelling.  ? Trouble breathing.  · You need reliever medicines more than 2-3 times a week.  · Your peak flow reading is still at 50-79% of your personal best after following the action plan for 1 hour.  · You have a fever.  Get help right away if:  · You seem to be worse and are not responding to medicine during an asthma attack.  · You are short of breath even at rest.  · You get short of breath when doing very little activity.  · You have trouble eating, drinking, or talking.  · You have chest pain or tightness.  · You have a fast heartbeat.  · Your lips or fingernails start to turn blue.  · You are light-headed or dizzy, or you faint.  · Your peak flow is less than 50% of your personal best.  · You feel too tired to breathe normally.  Summary  · Asthma is a long-term (chronic) condition in which the airways get tight and narrow. An asthma attack can make it hard to breathe.  · Asthma cannot be cured, but medicines and lifestyle changes can help control it.  · Make sure you understand how to avoid triggers and how and when to use your medicines.  This information is not intended to replace advice given to you by your health care provider. Make sure you discuss any questions you have with your health care provider.  Document  Released: 12/13/2007 Document Revised: 07/31/2016 Document Reviewed: 07/31/2016  Elsevier Interactive Patient Education © 2019 Elsevier Inc.

## 2018-09-09 NOTE — Progress Notes (Signed)
Subjective:  Patient ID: Anne Strong, female    DOB: 1946-06-05, 73 y.o.   MRN: [id number]  Chief Complaint:  One week follow up asthma   HPI: Anne Strong is a 73 y.o. female presenting on 09/09/2018 for One week follow up asthma   1. Mild persistent asthma without complication   Pt presents today for acute asthma exacerbation follow up. Pt was seen on 09/02/2018 for an asthma exacerbation and was given a depo-medrol injection and placed on tessalon for her cough. Pt states she feels 100% better. Pt states she has not had to use her rescue inhaler any. States she has a slight cough at times, states not daily. No nighttime awakenings. No shortness of breath or chest tightness. No fatigue or weakness. No headaches or weakness.    Relevant past medical, surgical, family, and social history reviewed and updated as indicated.  Allergies and medications reviewed and updated.   Past Medical History:  Diagnosis Date  . AAA (abdominal aortic aneurysm) ([place])   . Anemia   . Anxiety   . Arthritis   . Asthma   . Diabetes mellitus without complication ([place])   . Hyperlipidemia   . Hypertension     Past Surgical History:  Procedure Laterality Date  . ABDOMINAL HYSTERECTOMY    . APPENDECTOMY    . BUNIONECTOMY    . COLONOSCOPY N/A 11/09/2017   Procedure: COLONOSCOPY;  Surgeon: Danie Binder, MD;  Location: [facility];  Service: Endoscopy;  Laterality: N/A;  2:00  . GANGLION CYST EXCISION      Social History   Socioeconomic History  . Marital status: Divorced    Spouse name: Not on file  . Number of children: 2  . Years of education: 10  . Highest education level: 12th grade  Occupational History  . Occupation: Retired    Comment: Lexicographer  . Financial resource strain: Not hard at all  . Food insecurity:    Worry: Never true    Inability: Never true  . Transportation needs:    Medical: No    Non-medical: No  Tobacco Use  . Smoking status: Former Smoker   Types: Cigarettes    Last attempt to quit: 08/04/2012    Years since quitting: 6.1  . Smokeless tobacco: Never Used  Substance and Sexual Activity  . Alcohol use: No  . Drug use: No  . Sexual activity: Not Currently  Lifestyle  . Physical activity:    Days per week: 3 days    Minutes per session: 60 min  . Stress: Not at all  Relationships  . Social connections:    Talks on phone: More than three times a week    Gets together: More than three times a week    Attends religious service: More than 4 times per year    Active member of club or organization: Yes    Attends meetings of clubs or organizations: More than 4 times per year    Relationship status: Divorced  . Intimate partner violence:    Fear of current or ex partner: No    Emotionally abused: No    Physically abused: No    Forced sexual activity: No  Other Topics Concern  . Not on file  Social History Narrative  . Not on file    Outpatient Encounter Medications as of 09/09/2018  Medication Sig  . albuterol (PROVENTIL HFA;VENTOLIN HFA) 108 (90 Base) MCG/ACT inhaler Inhale 2 puffs into  the lungs every 6 (six) hours as needed for wheezing or shortness of breath.  Marland Kitchen amLODipine (NORVASC) 5 MG tablet Take 1 tablet (5 mg total) by mouth daily.  . benzonatate (TESSALON) 200 MG capsule Take 1 capsule (200 mg total) by mouth 3 (three) times daily as needed.  . budesonide (PULMICORT) 0.25 MG/2ML nebulizer solution Take 2 mLs (0.25 mg total) by nebulization 2 (two) times daily.  . fluticasone (FLONASE) 50 MCG/ACT nasal spray Place 2 sprays into both nostrils daily.  . metoprolol succinate (TOPROL-XL) 25 MG 24 hr tablet Take 1 tablet (25 mg total) by mouth daily.  . montelukast (SINGULAIR) 10 MG tablet Take 1 tablet (10 mg total) by mouth at bedtime.  Vladimir Faster Glycol-Propyl Glycol (SYSTANE OP) Place 2 drops into both eyes daily as needed (for dry eyes).  . pravastatin (PRAVACHOL) 40 MG tablet Take 1 tablet (40 mg total) by mouth  daily.  . predniSONE (STERAPRED UNI-PAK 21 TAB) 10 MG (21) TBPK tablet Use as directed  . ramipril (ALTACE) 10 MG capsule Take 1 capsule (10 mg total) by mouth daily.  . calcium-vitamin D (OSCAL WITH D) 500-200 MG-UNIT tablet Take 1 tablet by mouth daily for 30 days.   No facility-administered encounter medications on file as of 09/09/2018.     Allergies  Allergen Reactions  . Penicillins Anaphylaxis and Other (See Comments)    Has patient had a PCN reaction causing immediate rash, facial/tongue/throat swelling, SOB or lightheadedness with hypotension: Yes Has patient had a PCN reaction causing severe rash involving mucus membranes or skin necrosis: No Has patient had a PCN reaction that required hospitalization: Yes - In hospital Has patient had a PCN reaction occurring within the last 10 years: No If all of the above answers are "NO", then may proceed with Cephalosporin use.   . Codeine Itching and Other (See Comments)    [place]    Review of Systems  Constitutional: Negative for chills, fatigue and fever.  HENT: Negative for congestion.   Respiratory: Positive for cough. Negative for chest tightness, shortness of breath and wheezing.   Cardiovascular: Negative for chest pain and palpitations.  Neurological: Negative for weakness and headaches.  Psychiatric/Behavioral: Negative for confusion.  All other systems reviewed and are negative.       Objective:  BP 125/80   Pulse 75   Temp (!) 97.5 F (36.4 C) (Oral)   Ht 5' 2.5" (1.588 m)   Wt 175 lb (79.4 kg)   SpO2 96%   BMI 31.50 kg/m    Wt Readings from Last 3 Encounters:  09/09/18 175 lb (79.4 kg)  09/02/18 175 lb 3.2 oz (79.5 kg)  08/09/18 175 lb (79.4 kg)    Physical Exam Vitals signs and nursing note reviewed.  Constitutional:      General: She is not in acute distress.    Appearance: Normal appearance. She is not ill-appearing or toxic-appearing.  HENT:     Head: Normocephalic and atraumatic.      Mouth/Throat:     Mouth: Mucous membranes are moist.     Pharynx: Oropharynx is clear.  Eyes:     Conjunctiva/sclera: Conjunctivae normal.     Pupils: Pupils are equal, round, and reactive to light.  Neck:     Musculoskeletal: Normal range of motion and neck supple.  Cardiovascular:     Rate and Rhythm: Normal rate and regular rhythm.     Heart sounds: Normal heart sounds. No murmur. No friction rub. No gallop.  Pulmonary:     Effort: Pulmonary effort is normal.     Breath sounds: Normal breath sounds. No wheezing or rhonchi.  Skin:    General: Skin is warm and dry.     Capillary Refill: Capillary refill takes less than 2 seconds.  Neurological:     General: No focal deficit present.     Mental Status: She is alert and oriented to person, place, and time.  Psychiatric:        Mood and Affect: Mood normal.        Behavior: Behavior normal.        Thought Content: Thought content normal.        Judgment: Judgment normal.     Results for orders placed or performed in visit on 08/09/18  CBC with Differential/Platelet  Result Value Ref Range   WBC 4.7 3.4 - 10.8 x10E3/uL   RBC 3.81 3.77 - 5.28 x10E6/uL   Hemoglobin 11.1 11.1 - 15.9 g/dL   Hematocrit 33.3 (L) 34.0 - 46.6 %   MCV 87 79 - 97 fL   MCH 29.1 26.6 - 33.0 pg   MCHC 33.3 31.5 - 35.7 g/dL   RDW 13.0 11.7 - 15.4 %   Platelets 204 150 - 450 x10E3/uL   Neutrophils 63 Not Estab. %   Lymphs 22 Not Estab. %   Monocytes 10 Not Estab. %   Eos 4 Not Estab. %   Basos 1 Not Estab. %   Neutrophils Absolute 2.9 1.4 - 7.0 x10E3/uL   Lymphocytes Absolute 1.0 0.7 - 3.1 x10E3/uL   Monocytes Absolute 0.5 0.1 - 0.9 x10E3/uL   EOS (ABSOLUTE) 0.2 0.0 - 0.4 x10E3/uL   Basophils Absolute 0.0 0.0 - 0.2 x10E3/uL   Immature Granulocytes 0 Not Estab. %   Immature Grans (Abs) 0.0 0.0 - 0.1 x10E3/uL  CMP14+EGFR  Result Value Ref Range   Glucose 86 65 - 99 mg/dL   BUN 23 8 - 27 mg/dL   Creatinine, Ser 1.39 (H) 0.57 - 1.00 mg/dL   GFR  calc non Af Amer 38 (L) >59 mL/min/1.73   GFR calc Af Amer 44 (L) >59 mL/min/1.73   BUN/Creatinine Ratio 17 12 - 28   Sodium 141 134 - 144 mmol/L   Potassium 4.6 3.5 - 5.2 mmol/L   Chloride 103 96 - 106 mmol/L   CO2 24 20 - 29 mmol/L   Calcium 9.3 8.7 - 10.3 mg/dL   Total Protein 6.7 6.0 - 8.5 g/dL   Albumin 3.6 (L) 3.8 - 4.8 g/dL   Globulin, Total 3.1 1.5 - 4.5 g/dL   Albumin/Globulin Ratio 1.2 1.2 - 2.2   Bilirubin Total <0.2 0.0 - 1.2 mg/dL   Alkaline Phosphatase 54 39 - 117 IU/L   AST 11 0 - 40 IU/L   ALT 9 0 - 32 IU/L  Lipase  Result Value Ref Range   Lipase 20 14 - 72 U/L  TSH  Result Value Ref Range   TSH 0.791 0.450 - 4.500 uIU/mL  Microalbumin / creatinine urine ratio  Result Value Ref Range   Creatinine, Urine 153.4 Not Estab. mg/dL   Microalbumin, Urine 4.6 Not Estab. ug/mL   Microalb/Creat Ratio 3 0 - 29 mg/g creat  Vitamin B12  Result Value Ref Range   Vitamin B-12 >2000 (H) 232 - 1245 pg/mL       Pertinent labs & imaging results that were available during my care of the patient were reviewed by me and considered in my medical  decision making.  Assessment & Plan:  Anne Strong was seen today for one week follow up asthma.  Diagnoses and all orders for this visit:  Mild persistent asthma without complication Doing well on current regimen, will continue. Report any new or worsening symptoms.    Continue all other maintenance medications.  Follow up plan: Return in about 1 month (around 10/10/2018), or if symptoms worsen or fail to improve, for HTN.  Educational handout given for asthma  The above assessment and management plan was discussed with the patient. The patient verbalized understanding of and has agreed to the management plan. Patient is aware to call the clinic if symptoms persist or worsen. Patient is aware when to return to the clinic for a follow-up visit. Patient educated on when it is appropriate to go to the emergency department.   Monia Pouch,  FNP-C [facility] [phone]

## 2018-10-11 ENCOUNTER — Other Ambulatory Visit: Payer: Self-pay

## 2018-10-11 NOTE — Patient Outreach (Signed)
[facility]) Care Management  10/11/2018  BRANNON DECAIRE [birth year] [id number]   Medication Adherence call to Lujean Rave patient did not answer patient is due on Ramipril 10 mg and Pravastatin 40 mg under [facility].   [facility] Direct Dial [phone]  Fax [phone] [email or web address]

## 2018-11-07 ENCOUNTER — Ambulatory Visit (INDEPENDENT_AMBULATORY_CARE_PROVIDER_SITE_OTHER): Payer: Medicare Other | Admitting: Family Medicine

## 2018-11-07 ENCOUNTER — Other Ambulatory Visit: Payer: Self-pay

## 2018-11-07 ENCOUNTER — Encounter: Payer: Self-pay | Admitting: Family Medicine

## 2018-11-07 DIAGNOSIS — J4530 Mild persistent asthma, uncomplicated: Secondary | ICD-10-CM

## 2018-11-07 DIAGNOSIS — I10 Essential (primary) hypertension: Secondary | ICD-10-CM | POA: Diagnosis not present

## 2018-11-07 DIAGNOSIS — I700 Atherosclerosis of aorta: Secondary | ICD-10-CM | POA: Diagnosis not present

## 2018-11-07 DIAGNOSIS — M069 Rheumatoid arthritis, unspecified: Secondary | ICD-10-CM

## 2018-11-07 DIAGNOSIS — E782 Mixed hyperlipidemia: Secondary | ICD-10-CM | POA: Diagnosis not present

## 2018-11-07 MED ORDER — BUDESONIDE 0.25 MG/2ML IN SUSP: 0.2500 mg | Freq: Two times a day (BID) | RESPIRATORY_TRACT | 12 refills | Status: AC

## 2018-11-07 MED ORDER — PRAVASTATIN SODIUM 40 MG PO TABS: 40.0000 mg | ORAL_TABLET | Freq: Every day | ORAL | 0 refills | Status: DC

## 2018-11-07 MED ORDER — ALBUTEROL SULFATE HFA 108 (90 BASE) MCG/ACT IN AERS: 2.0000 | INHALATION_SPRAY | Freq: Four times a day (QID) | RESPIRATORY_TRACT | 1 refills | Status: AC | PRN

## 2018-11-07 MED ORDER — MONTELUKAST SODIUM 10 MG PO TABS: 10.0000 mg | ORAL_TABLET | Freq: Every day | ORAL | 0 refills | Status: DC

## 2018-11-07 MED ORDER — METOPROLOL SUCCINATE ER 25 MG PO TB24: 25.0000 mg | ORAL_TABLET | Freq: Every day | ORAL | 1 refills | Status: AC

## 2018-11-07 MED ORDER — FLUTICASONE PROPIONATE 50 MCG/ACT NA SUSP: 2.0000 | Freq: Every day | NASAL | 1 refills | Status: AC

## 2018-11-07 MED ORDER — ASPIRIN EC 81 MG PO TBEC: 81.0000 mg | DELAYED_RELEASE_TABLET | Freq: Every day | ORAL | 6 refills | Status: AC

## 2018-11-07 MED ORDER — AMLODIPINE BESYLATE 5 MG PO TABS: 5.0000 mg | ORAL_TABLET | Freq: Every day | ORAL | 1 refills | Status: AC

## 2018-11-07 MED ORDER — RAMIPRIL 10 MG PO CAPS: 10.0000 mg | ORAL_CAPSULE | Freq: Every day | ORAL | 0 refills | Status: DC

## 2018-11-07 MED ORDER — CALCIUM CARBONATE-VITAMIN D 500-200 MG-UNIT PO TABS: 1.0000 | ORAL_TABLET | Freq: Every day | ORAL | 3 refills | Status: DC

## 2018-11-07 NOTE — Progress Notes (Signed)
Virtual Visit via telephone Note Due to COVID-19, visit is conducted virtually and was requested by patient. This visit type was conducted due to national recommendations for restrictions regarding the COVID-19 Pandemic (e.g. social distancing) in an effort to limit this patient's exposure and mitigate transmission in our community.  Due to her co-morbid illnesses, this patient is at least at moderate risk for complications without adequate follow up.  This format is felt to be most appropriate for this patient at this time.  All issues noted in this document were discussed and addressed.  A physical exam was not performed with this format.   I connected with Anne Strong on 11/07/18 at 1455 by telephone and verified that I am speaking with the correct person using two identifiers. Anne Strong is currently located at home and no one is currently with them during visit. The provider, Monia Pouch, FNP is located in their office at time of visit.  I discussed the limitations, risks, security and privacy concerns of performing an evaluation and management service by telephone and the availability of in person appointments. I also discussed with the patient that there may be a patient responsible charge related to this service. The patient expressed understanding and agreed to proceed.  Subjective:  Patient ID: Anne Strong, female    DOB: 1945/11/20, 73 y.o.   MRN: [id number]  Chief Complaint:  Medical Management of Chronic Issues   HPI: Anne Strong is a 73 y.o. female presenting on 11/07/2018 for Medical Management of Chronic Issues  1. Aortic atherosclerosis ([place]) Doing well. Denies chest pain, palpitations, shortness of breath, back pain, abdominal pain, dizziness, or syncope. No lower extremity weakness or color changes. Taking medications as prescribed without associated side effects.   2. Essential hypertension Complaint with meds - Yes Checking BP at home ranging 125/75 Exercising  Regularly - No Watching Salt intake - Yes Pertinent ROS:  Headache - No Chest pain - No Dyspnea - No Palpitations - No LE edema - No They report good compliance with medications and can restate their regimen by memory. No medication side effects.  BP Readings from Last 3 Encounters:  09/09/18 125/80  09/02/18 116/77  08/09/18 133/83     3. Mild persistent asthma without complication Doing well on current medication regimen. Denies night time awakenings, cough, shortness of breath, wheezing, or fatigue. Only uses SABA when needed. Has not needed in several days.   4. Mixed hyperlipidemia Does try to watch diet and exercise when able. Taking medications as prescribed without associated side effects.    Relevant past medical, surgical, family, and social history reviewed and updated as indicated.  Allergies and medications reviewed and updated.   Past Medical History:  Diagnosis Date  . AAA (abdominal aortic aneurysm) ([place])   . Anemia   . Anxiety   . Arthritis   . Asthma   . Diabetes mellitus without complication ([place])   . Hyperlipidemia   . Hypertension     Past Surgical History:  Procedure Laterality Date  . ABDOMINAL HYSTERECTOMY    . APPENDECTOMY    . BUNIONECTOMY    . COLONOSCOPY N/A 11/09/2017   Procedure: COLONOSCOPY;  Surgeon: Danie Binder, MD;  [facility];  Service: Endoscopy;  Laterality: N/A;  2:00  . GANGLION CYST EXCISION      Social History   Socioeconomic History  . Marital status: Divorced    Spouse name: Not on file  . Number of children: 2  .  Years of education: 84  . Highest education level: 12th grade  Occupational History  . Occupation: Retired    Comment: Lexicographer  . Financial resource strain: Not hard at all  . Food insecurity:    Worry: Never true    Inability: Never true  . Transportation needs:    Medical: No    Non-medical: No  Tobacco Use  . Smoking status: Former Smoker    Types: Cigarettes     Last attempt to quit: 08/04/2012    Years since quitting: 6.2  . Smokeless tobacco: Never Used  Substance and Sexual Activity  . Alcohol use: No  . Drug use: No  . Sexual activity: Not Currently  Lifestyle  . Physical activity:    Days per week: 3 days    Minutes per session: 60 min  . Stress: Not at all  Relationships  . Social connections:    Talks on phone: More than three times a week    Gets together: More than three times a week    Attends religious service: More than 4 times per year    Active member of club or organization: Yes    Attends meetings of clubs or organizations: More than 4 times per year    Relationship status: Divorced  . Intimate partner violence:    Fear of current or ex partner: No    Emotionally abused: No    Physically abused: No    Forced sexual activity: No  Other Topics Concern  . Not on file  Social History Narrative  . Not on file    Outpatient Encounter Medications as of 11/07/2018  Medication Sig  . albuterol (VENTOLIN HFA) 108 (90 Base) MCG/ACT inhaler Inhale 2 puffs into the lungs every 6 (six) hours as needed for wheezing or shortness of breath.  Marland Kitchen amLODipine (NORVASC) 5 MG tablet Take 1 tablet (5 mg total) by mouth daily.  Marland Kitchen aspirin EC 81 MG tablet Take 1 tablet (81 mg total) by mouth daily for 30 days.  . budesonide (PULMICORT) 0.25 MG/2ML nebulizer solution Take 2 mLs (0.25 mg total) by nebulization 2 (two) times daily.  . calcium-vitamin D (OSCAL WITH D) 500-200 MG-UNIT tablet Take 1 tablet by mouth daily for 30 days.  . fluticasone (FLONASE) 50 MCG/ACT nasal spray Place 2 sprays into both nostrils daily.  . metoprolol succinate (TOPROL-XL) 25 MG 24 hr tablet Take 1 tablet (25 mg total) by mouth daily.  . montelukast (SINGULAIR) 10 MG tablet Take 1 tablet (10 mg total) by mouth at bedtime.  Vladimir Faster Glycol-Propyl Glycol (SYSTANE OP) Place 2 drops into both eyes daily as needed (for dry eyes).  . pravastatin (PRAVACHOL) 40 MG tablet  Take 1 tablet (40 mg total) by mouth daily.  . ramipril (ALTACE) 10 MG capsule Take 1 capsule (10 mg total) by mouth daily.  . [DISCONTINUED] albuterol (PROVENTIL HFA;VENTOLIN HFA) 108 (90 Base) MCG/ACT inhaler Inhale 2 puffs into the lungs every 6 (six) hours as needed for wheezing or shortness of breath.  . [DISCONTINUED] amLODipine (NORVASC) 5 MG tablet Take 1 tablet (5 mg total) by mouth daily.  . [DISCONTINUED] benzonatate (TESSALON) 200 MG capsule Take 1 capsule (200 mg total) by mouth 3 (three) times daily as needed.  . [DISCONTINUED] budesonide (PULMICORT) 0.25 MG/2ML nebulizer solution Take 2 mLs (0.25 mg total) by nebulization 2 (two) times daily.  . [DISCONTINUED] calcium-vitamin D (OSCAL WITH D) 500-200 MG-UNIT tablet Take 1 tablet by mouth daily for 30  days.  . [DISCONTINUED] fluticasone (FLONASE) 50 MCG/ACT nasal spray Place 2 sprays into both nostrils daily.  . [DISCONTINUED] metoprolol succinate (TOPROL-XL) 25 MG 24 hr tablet Take 1 tablet (25 mg total) by mouth daily.  . [DISCONTINUED] montelukast (SINGULAIR) 10 MG tablet Take 1 tablet (10 mg total) by mouth at bedtime.  . [DISCONTINUED] pravastatin (PRAVACHOL) 40 MG tablet Take 1 tablet (40 mg total) by mouth daily.  . [DISCONTINUED] predniSONE (STERAPRED UNI-PAK 21 TAB) 10 MG (21) TBPK tablet Use as directed  . [DISCONTINUED] ramipril (ALTACE) 10 MG capsule Take 1 capsule (10 mg total) by mouth daily.   No facility-administered encounter medications on file as of 11/07/2018.     Allergies  Allergen Reactions  . Penicillins Anaphylaxis and Other (See Comments)    Has patient had a PCN reaction causing immediate rash, facial/tongue/throat swelling, SOB or lightheadedness with hypotension: Yes Has patient had a PCN reaction causing severe rash involving mucus membranes or skin necrosis: No Has patient had a PCN reaction that required hospitalization: Yes - In hospital Has patient had a PCN reaction occurring within the last 10  years: No If all of the above answers are "NO", then may proceed with Cephalosporin use.   . Codeine Itching and Other (See Comments)    [place]    Review of Systems  Constitutional: Negative for activity change, appetite change, chills, fatigue, fever and unexpected weight change.  Eyes: Negative for photophobia and visual disturbance.  Respiratory: Negative for cough, chest tightness, shortness of breath and wheezing.   Cardiovascular: Negative for chest pain, palpitations and leg swelling.  Gastrointestinal: Negative for abdominal pain, constipation, diarrhea, nausea and vomiting.  Genitourinary: Negative for decreased urine volume and difficulty urinating.  Musculoskeletal: Negative for arthralgias, back pain and myalgias.  Neurological: Negative for dizziness, tremors, seizures, syncope, facial asymmetry, speech difficulty, weakness, light-headedness, numbness and headaches.  Psychiatric/Behavioral: Negative for confusion.  All other systems reviewed and are negative.        Observations/Objective: No vital signs or physical exam, this was a telephone or virtual health encounter.  Pt alert and oriented, answers all questions appropriately, and able to speak in full sentences.    Assessment and Plan: Anne Strong was seen today for medical management of chronic issues.  Diagnoses and all orders for this visit:  Aortic atherosclerosis ([place]) Diet and exercise encouraged. Continue medications as prescribed.  -     aspirin EC 81 MG tablet; Take 1 tablet (81 mg total) by mouth daily for 30 days. -     pravastatin (PRAVACHOL) 40 MG tablet; Take 1 tablet (40 mg total) by mouth daily.  Essential hypertension Stable. Continue below. Follow up in 3 months for labs.  -     amLODipine (NORVASC) 5 MG tablet; Take 1 tablet (5 mg total) by mouth daily. -     metoprolol succinate (TOPROL-XL) 25 MG 24 hr tablet; Take 1 tablet (25 mg total) by mouth daily. -     ramipril (ALTACE) 10 MG capsule;  Take 1 capsule (10 mg total) by mouth daily.  Mild persistent asthma without complication Doing well on current medication regimen. No exacerbations. Continue below.  -     albuterol (VENTOLIN HFA) 108 (90 Base) MCG/ACT inhaler; Inhale 2 puffs into the lungs every 6 (six) hours as needed for wheezing or shortness of breath. -     budesonide (PULMICORT) 0.25 MG/2ML nebulizer solution; Take 2 mLs (0.25 mg total) by nebulization 2 (two) times daily. -  fluticasone (FLONASE) 50 MCG/ACT nasal spray; Place 2 sprays into both nostrils daily. -     montelukast (SINGULAIR) 10 MG tablet; Take 1 tablet (10 mg total) by mouth at bedtime.  Mixed hyperlipidemia Diet and exercise encouraged. Continue medications as prescribed.  -     pravastatin (PRAVACHOL) 40 MG tablet; Take 1 tablet (40 mg total) by mouth daily.      Follow Up Instructions: Return in about 3 months (around 02/06/2019), or if symptoms worsen or fail to improve, for HTN, Lipids, Asthma.    I discussed the assessment and treatment plan with the patient. The patient was provided an opportunity to ask questions and all were answered. The patient agreed with the plan and demonstrated an understanding of the instructions.   The patient was advised to call back or seek an in-person evaluation if the symptoms worsen or if the condition fails to improve as anticipated.  The above assessment and management plan was discussed with the patient. The patient verbalized understanding of and has agreed to the management plan. Patient is aware to call the clinic if symptoms persist or worsen. Patient is aware when to return to the clinic for a follow-up visit. Patient educated on when it is appropriate to go to the emergency department.    I provided 15 minutes of non-face-to-face time during this encounter. The call started at 1455. The call ended at 1510.   Monia Pouch, FNP-C [facility] [address]  [place], [place] [address] [phone]

## 2018-12-04 ENCOUNTER — Other Ambulatory Visit: Payer: Self-pay

## 2018-12-05 ENCOUNTER — Encounter: Payer: Self-pay | Admitting: Family Medicine

## 2018-12-05 ENCOUNTER — Ambulatory Visit (INDEPENDENT_AMBULATORY_CARE_PROVIDER_SITE_OTHER): Payer: Medicare Other | Admitting: Family Medicine

## 2018-12-05 DIAGNOSIS — R059 Cough, unspecified: Secondary | ICD-10-CM

## 2018-12-05 DIAGNOSIS — M05731 Rheumatoid arthritis with rheumatoid factor of right wrist without organ or systems involvement: Secondary | ICD-10-CM

## 2018-12-05 DIAGNOSIS — R05 Cough: Secondary | ICD-10-CM

## 2018-12-05 MED ORDER — PREDNISONE 20 MG PO TABS: ORAL_TABLET | ORAL | 0 refills | Status: DC

## 2018-12-05 MED ORDER — BENZONATATE 100 MG PO CAPS: 100.0000 mg | ORAL_CAPSULE | Freq: Two times a day (BID) | ORAL | 0 refills | Status: DC | PRN

## 2018-12-05 NOTE — Progress Notes (Signed)
Virtual Visit via telephone Note Due to COVID-19, visit is conducted virtually and was requested by patient. This visit type was conducted due to national recommendations for restrictions regarding the COVID-19 Pandemic (e.g. social distancing) in an effort to limit this patient's exposure and mitigate transmission in our community. All issues noted in this document were discussed and addressed.  A physical exam was not performed with this format.   I connected with Anne Strong on 12/05/18 at 0800 by telephone and verified that I am speaking with the correct person using two identifiers. Anne Strong is currently located at home and no one is currently with them during visit. The provider, Monia Pouch, FNP is located in their office at time of visit.  I discussed the limitations, risks, security and privacy concerns of performing an evaluation and management service by telephone and the availability of in person appointments. I also discussed with the patient that there may be a patient responsible charge related to this service. The patient expressed understanding and agreed to proceed.  Subjective:  Patient ID: Anne Strong, female    DOB: 02-27-1946, 73 y.o.   MRN: [id number]  Chief Complaint:  Arthritis   HPI: Anne Strong is a 73 y.o. female presenting on 12/05/2018 for Arthritis   Pt reports an increase in her RA pain. States she has been unable to go to the gym due to COVID-19. States she is taking Aleve twice daily. She is not on any DMARDs. States she has seen a specialist and received shots before. States she does not wish to do this again. She is not on steroids. She states she has pain in her hands, knees, shoulders, and elbows. States the Aleve helps some, but does not get rid of the pain and swelling completely. No other associated symptoms.  Pt also reports she has a slight cough at night. States she is not wheezing or short of breath. Does not feel this is asthma related.  States the cough is only at night. States she would like something for the cough. No fever, chills, shortness of breath, congestion, or reflux symptoms.   Arthritis  Presents for follow-up visit. She complains of pain, stiffness and joint swelling. She reports no joint warmth. The symptoms have been worsening. Affected locations include the right shoulder, left shoulder, right elbow, left elbow, right wrist, left wrist, left knee and right knee. Her pain is at a severity of 6/10. Associated symptoms include pain at night and pain while resting. Pertinent negatives include no diarrhea, dry eyes, dry mouth, dysuria, fatigue, fever, rash, Raynaud's syndrome, uveitis or weight loss.     Relevant past medical, surgical, family, and social history reviewed and updated as indicated.  Allergies and medications reviewed and updated.   Past Medical History:  Diagnosis Date  . AAA (abdominal aortic aneurysm) ([place])   . Anemia   . Anxiety   . Arthritis   . Asthma   . Diabetes mellitus without complication ([place])   . Hyperlipidemia   . Hypertension     Past Surgical History:  Procedure Laterality Date  . ABDOMINAL HYSTERECTOMY    . APPENDECTOMY    . BUNIONECTOMY    . COLONOSCOPY N/A 11/09/2017   Procedure: COLONOSCOPY;  Surgeon: Danie Binder, MD;  [facility];  Service: Endoscopy;  Laterality: N/A;  2:00  . GANGLION CYST EXCISION      Social History   Socioeconomic History  . Marital status: Divorced  Spouse name: Not on file  . Number of children: 2  . Years of education: 41  . Highest education level: 12th grade  Occupational History  . Occupation: Retired    Comment: Lexicographer  . Financial resource strain: Not hard at all  . Food insecurity:    Worry: Never true    Inability: Never true  . Transportation needs:    Medical: No    Non-medical: No  Tobacco Use  . Smoking status: Former Smoker    Types: Cigarettes    Last attempt to quit: 08/04/2012     Years since quitting: 6.3  . Smokeless tobacco: Never Used  Substance and Sexual Activity  . Alcohol use: No  . Drug use: No  . Sexual activity: Not Currently  Lifestyle  . Physical activity:    Days per week: 3 days    Minutes per session: 60 min  . Stress: Not at all  Relationships  . Social connections:    Talks on phone: More than three times a week    Gets together: More than three times a week    Attends religious service: More than 4 times per year    Active member of club or organization: Yes    Attends meetings of clubs or organizations: More than 4 times per year    Relationship status: Divorced  . Intimate partner violence:    Fear of current or ex partner: No    Emotionally abused: No    Physically abused: No    Forced sexual activity: No  Other Topics Concern  . Not on file  Social History Narrative  . Not on file    Outpatient Encounter Medications as of 12/05/2018  Medication Sig  . albuterol (VENTOLIN HFA) 108 (90 Base) MCG/ACT inhaler Inhale 2 puffs into the lungs every 6 (six) hours as needed for wheezing or shortness of breath.  Marland Kitchen amLODipine (NORVASC) 5 MG tablet Take 1 tablet (5 mg total) by mouth daily.  Marland Kitchen aspirin EC 81 MG tablet Take 1 tablet (81 mg total) by mouth daily for 30 days.  . benzonatate (TESSALON) 100 MG capsule Take 1 capsule (100 mg total) by mouth 2 (two) times daily as needed for cough.  . budesonide (PULMICORT) 0.25 MG/2ML nebulizer solution Take 2 mLs (0.25 mg total) by nebulization 2 (two) times daily.  . calcium-vitamin D (OSCAL WITH D) 500-200 MG-UNIT tablet Take 1 tablet by mouth daily for 30 days.  . fluticasone (FLONASE) 50 MCG/ACT nasal spray Place 2 sprays into both nostrils daily.  . metoprolol succinate (TOPROL-XL) 25 MG 24 hr tablet Take 1 tablet (25 mg total) by mouth daily.  . montelukast (SINGULAIR) 10 MG tablet Take 1 tablet (10 mg total) by mouth at bedtime.  Vladimir Faster Glycol-Propyl Glycol (SYSTANE OP) Place 2 drops  into both eyes daily as needed (for dry eyes).  . pravastatin (PRAVACHOL) 40 MG tablet Take 1 tablet (40 mg total) by mouth daily.  . predniSONE (DELTASONE) 20 MG tablet 2 po at sametime daily for 5 days  . ramipril (ALTACE) 10 MG capsule Take 1 capsule (10 mg total) by mouth daily.   No facility-administered encounter medications on file as of 12/05/2018.     Allergies  Allergen Reactions  . Penicillins Anaphylaxis and Other (See Comments)    Has patient had a PCN reaction causing immediate rash, facial/tongue/throat swelling, SOB or lightheadedness with hypotension: Yes Has patient had a PCN reaction causing severe rash involving mucus  membranes or skin necrosis: No Has patient had a PCN reaction that required hospitalization: Yes - In hospital Has patient had a PCN reaction occurring within the last 10 years: No If all of the above answers are "NO", then may proceed with Cephalosporin use.   . Codeine Itching and Other (See Comments)    [place]    Review of Systems  Constitutional: Positive for activity change. Negative for appetite change, chills, fatigue, fever, unexpected weight change and weight loss.  Respiratory: Positive for cough. Negative for chest tightness, shortness of breath and wheezing.   Cardiovascular: Negative for chest pain, palpitations and leg swelling.  Gastrointestinal: Negative for diarrhea.  Genitourinary: Negative for dysuria.  Musculoskeletal: Positive for arthralgias, arthritis, back pain, joint swelling and stiffness. Negative for gait problem, myalgias, neck pain and neck stiffness.  Skin: Negative for color change, rash and wound.  Neurological: Negative for dizziness, tremors, seizures, syncope, facial asymmetry, speech difficulty, weakness, light-headedness, numbness and headaches.  Psychiatric/Behavioral: Negative for confusion.  All other systems reviewed and are negative.        Observations/Objective: No vital signs or physical exam, this  was a telephone or virtual health encounter.  Pt alert and oriented, answers all questions appropriately, and able to speak in full sentences.    Assessment and Plan: Marg was seen today for arthritis.  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 Can take Tylenol as needed. Will trial burst prednisone. If symptoms persist or worsen, may need referral to Rheumatologist.  -     predniSONE (DELTASONE) 20 MG tablet; 2 po at sametime daily for 5 days  Cough Report any new or worsening symptoms. Medications as prescribed.  -     benzonatate (TESSALON) 100 MG capsule; Take 1 capsule (100 mg total) by mouth 2 (two) times daily as needed for cough.     Follow Up Instructions: Return in about 6 weeks (around 01/16/2019), or if symptoms worsen or fail to improve, for arthritis, asthma.    I discussed the assessment and treatment plan with the patient. The patient was provided an opportunity to ask questions and all were answered. The patient agreed with the plan and demonstrated an understanding of the instructions.   The patient was advised to call back or seek an in-person evaluation if the symptoms worsen or if the condition fails to improve as anticipated.  The above assessment and management plan was discussed with the patient. The patient verbalized understanding of and has agreed to the management plan. Patient is aware to call the clinic if symptoms persist or worsen. Patient is aware when to return to the clinic for a follow-up visit. Patient educated on when it is appropriate to go to the emergency department.    I provided 18 minutes of non-face-to-face time during this encounter. The call started at 0800. The call ended at 0815. The other time was used for coordination of care.    Monia Pouch, FNP-C [facility] [address] [place], [place]  [address] [phone]

## 2018-12-12 ENCOUNTER — Telehealth: Payer: Self-pay | Admitting: Family Medicine

## 2018-12-12 NOTE — Telephone Encounter (Signed)
Glad to hear. 

## 2018-12-21 ENCOUNTER — Other Ambulatory Visit: Payer: Self-pay | Admitting: Family Medicine

## 2018-12-21 DIAGNOSIS — M069 Rheumatoid arthritis, unspecified: Secondary | ICD-10-CM

## 2018-12-23 ENCOUNTER — Other Ambulatory Visit: Payer: Self-pay | Admitting: *Deleted

## 2018-12-23 MED ORDER — VITAMIN B-12 100 MCG PO TABS: 100.0000 ug | ORAL_TABLET | Freq: Every day | ORAL | 1 refills | Status: AC

## 2018-12-23 NOTE — Addendum Note (Signed)
Addended by: Marylin Crosby on: 12/23/2018 01:59 PM   Modules accepted: Orders

## 2018-12-23 NOTE — Telephone Encounter (Signed)
She is taking this daily and it can be refilled. I am not sure why this is not on the med list.

## 2018-12-23 NOTE — Telephone Encounter (Signed)
rx sent to pharmacy per Provider.

## 2018-12-23 NOTE — Telephone Encounter (Signed)
Fax from [facility] RF Vit B12 100 mcg 1QD Not on med list Please advise

## 2019-03-05 ENCOUNTER — Ambulatory Visit (INDEPENDENT_AMBULATORY_CARE_PROVIDER_SITE_OTHER): Payer: Medicare Other | Admitting: *Deleted

## 2019-03-05 DIAGNOSIS — Z0000 Encounter for general adult medical examination without abnormal findings: Secondary | ICD-10-CM

## 2019-03-05 NOTE — Patient Instructions (Signed)
Preventive Care 38 Years and Older, Female Preventive care refers to lifestyle choices and visits with your health care provider that can promote health and wellness. This includes:  A yearly physical exam. This is also called an annual well check.  Regular dental and eye exams.  Immunizations.  Screening for certain conditions.  Healthy lifestyle choices, such as diet and exercise. What can I expect for my preventive care visit? Physical exam Your health care provider will check:  Height and weight. These may be used to calculate body mass index (BMI), which is a measurement that tells if you are at a healthy weight.  Heart rate and blood pressure.  Your skin for abnormal spots. Counseling Your health care provider may ask you questions about:  Alcohol, tobacco, and drug use.  Emotional well-being.  Home and relationship well-being.  Sexual activity.  Eating habits.  History of falls.  Memory and ability to understand (cognition).  Work and work Statistician.  Pregnancy and menstrual history. What immunizations do I need?  Influenza (flu) vaccine  This is recommended every year. Tetanus, diphtheria, and pertussis (Tdap) vaccine  You may need a Td booster every 10 years. Varicella (chickenpox) vaccine  You may need this vaccine if you have not already been vaccinated. Zoster (shingles) vaccine  You may need this after age 33. Pneumococcal conjugate (PCV13) vaccine  One dose is recommended after age 33. Pneumococcal polysaccharide (PPSV23) vaccine  One dose is recommended after age 72. Measles, mumps, and rubella (MMR) vaccine  You may need at least one dose of MMR if you were born in 1957 or later. You may also need a second dose. Meningococcal conjugate (MenACWY) vaccine  You may need this if you have certain conditions. Hepatitis A vaccine  You may need this if you have certain conditions or if you travel or work in places where you may be exposed  to hepatitis A. Hepatitis B vaccine  You may need this if you have certain conditions or if you travel or work in places where you may be exposed to hepatitis B. Haemophilus influenzae type b (Hib) vaccine  You may need this if you have certain conditions. You may receive vaccines as individual doses or as more than one vaccine together in one shot (combination vaccines). Talk with your health care provider about the risks and benefits of combination vaccines. What tests do I need? Blood tests  Lipid and cholesterol levels. These may be checked every 5 years, or more frequently depending on your overall health.  Hepatitis C test.  Hepatitis B test. Screening  Lung cancer screening. You may have this screening every year starting at age 39 if you have a 30-pack-year history of smoking and currently smoke or have quit within the past 15 years.  Colorectal cancer screening. All adults should have this screening starting at age 36 and continuing until age 15. Your health care provider may recommend screening at age 23 if you are at increased risk. You will have tests every 1-10 years, depending on your results and the type of screening test.  Diabetes screening. This is done by checking your blood sugar (glucose) after you have not eaten for a while (fasting). You may have this done every 1-3 years.  Mammogram. This may be done every 1-2 years. Talk with your health care provider about how often you should have regular mammograms.  BRCA-related cancer screening. This may be done if you have a family history of breast, ovarian, tubal, or peritoneal cancers.  Other tests  Sexually transmitted disease (STD) testing.  Bone density scan. This is done to screen for osteoporosis. You may have this done starting at age 76. Follow these instructions at home: Eating and drinking  Eat a diet that includes fresh fruits and vegetables, whole grains, lean protein, and low-fat dairy products. Limit  your intake of foods with high amounts of sugar, saturated fats, and salt.  Take vitamin and mineral supplements as recommended by your health care provider.  Do not drink alcohol if your health care provider tells you not to drink.  If you drink alcohol: ? Limit how much you have to 0-1 drink a day. ? Be aware of how much alcohol is in your drink. In the [place] one drink equals one 12 oz bottle of beer (355 mL), one 5 oz glass of wine (148 mL), or one 1 oz glass of hard liquor (44 mL). Lifestyle  Take daily care of your teeth and gums.  Stay active. Exercise for at least 30 minutes on 5 or more days each week.  Do not use any products that contain nicotine or tobacco, such as cigarettes, e-cigarettes, and chewing tobacco. If you need help quitting, ask your health care provider.  If you are sexually active, practice safe sex. Use a condom or other form of protection in order to prevent STIs (sexually transmitted infections).  Talk with your health care provider about taking a low-dose aspirin or statin. What's next?  Go to your health care provider once a year for a well check visit.  Ask your health care provider how often you should have your eyes and teeth checked.  Stay up to date on all vaccines. This information is not intended to replace advice given to you by your health care provider. Make sure you discuss any questions you have with your health care provider. Document Released: 07/23/2015 Document Revised: 06/20/2018 Document Reviewed: 06/20/2018 Elsevier Patient Education  2020 [employer].

## 2019-03-05 NOTE — Progress Notes (Signed)
MEDICARE ANNUAL WELLNESS VISIT  03/05/2019  Telephone Visit Disclaimer This Medicare AWV was conducted by telephone due to national recommendations for restrictions regarding the COVID-19 Pandemic (e.g. social distancing).  I verified, using two identifiers, that I am speaking with Anne Strong or their authorized healthcare agent. I discussed the limitations, risks, security, and privacy concerns of performing an evaluation and management service by telephone and the potential availability of an in-person appointment in the future. The patient expressed understanding and agreed to proceed.   Subjective:  Anne Strong is a 73 y.o. female patient of Rakes, Connye Burkitt, Kraemer who had a Medicare Annual Wellness Visit today via telephone. Babe is Retired and lives alone but will be moving back to [place] by the first of November to be closer to her family . she has 2 children. she reports that she is socially active and does interact with friends/family regularly. she is minimally physically active and enjoys reading, playing solitaire and cooking.  Patient Care Team: Baruch Gouty, FNP as PCP - General (Family Medicine) Gala Romney Cristopher Estimable, MD as Consulting Physician (Gastroenterology)  Advanced Directives 03/05/2019 02/27/2018 11/09/2017  Does Patient Have a Medical Advance Directive? No No No  Would patient like information on creating a medical advance directive? No - Patient declined Yes (MAU/Ambulatory/Procedural Areas - Information given) No - Patient declined    Hospital Utilization Over the Past 12 Months: # of hospitalizations or ER visits: 2 # of surgeries: 0  Review of Systems    Patient reports that her overall health is worse compared to last year.  Patient Reported Readings (BP, Pulse, CBG, Weight, etc) none  Review of Systems: History obtained from chart review  All other systems negative.  Pain Assessment Pain : 0-10 Pain Score: 6  Pain Type: Chronic pain(multiple  joint pain due to her RA) Pain Descriptors / Indicators: Throbbing, Aching, Heaviness     Current Medications & Allergies (verified) Allergies as of 03/05/2019      Reactions   Penicillins Anaphylaxis, Other (See Comments)   Has patient had a PCN reaction causing immediate rash, facial/tongue/throat swelling, SOB or lightheadedness with hypotension: Yes Has patient had a PCN reaction causing severe rash involving mucus membranes or skin necrosis: No Has patient had a PCN reaction that required hospitalization: Yes - In hospital Has patient had a PCN reaction occurring within the last 10 years: No If all of the above answers are "NO", then may proceed with Cephalosporin use.   Codeine Itching, Other (See Comments)   [place]      Medication List       Accurate as of March 05, 2019 10:54 AM. If you have any questions, ask your nurse or doctor.        STOP taking these medications   benzonatate 100 MG capsule Commonly known as: TESSALON   predniSONE 20 MG tablet Commonly known as: Deltasone     TAKE these medications   albuterol 108 (90 Base) MCG/ACT inhaler Commonly known as: VENTOLIN HFA Inhale 2 puffs into the lungs every 6 (six) hours as needed for wheezing or shortness of breath.   amLODipine 5 MG tablet Commonly known as: NORVASC Take 1 tablet (5 mg total) by mouth daily.   budesonide 0.25 MG/2ML nebulizer solution Commonly known as: PULMICORT Take 2 mLs (0.25 mg total) by nebulization 2 (two) times daily.   calcium-vitamin D 500-200 MG-UNIT tablet Commonly known as: OSCAL WITH D Take 1 tablet by mouth daily  for 30 days.   celecoxib 200 MG capsule Commonly known as: CELEBREX Take 200 mg by mouth 2 (two) times daily.   fluticasone 50 MCG/ACT nasal spray Commonly known as: FLONASE Place 2 sprays into both nostrils daily.   metoprolol succinate 25 MG 24 hr tablet Commonly known as: TOPROL-XL Take 1 tablet (25 mg total) by mouth daily.    mometasone-formoterol 100-5 MCG/ACT Aero Commonly known as: DULERA Inhale 1 puff into the lungs daily. Was given to her by a doctor in [place]   montelukast 10 MG tablet Commonly known as: SINGULAIR Take 1 tablet (10 mg total) by mouth at bedtime.   pravastatin 40 MG tablet Commonly known as: PRAVACHOL Take 1 tablet (40 mg total) by mouth daily.   ramipril 10 MG capsule Commonly known as: ALTACE Take 1 capsule (10 mg total) by mouth daily.   SYSTANE OP Place 2 drops into both eyes daily as needed (for dry eyes).   vitamin B-12 100 MCG tablet Commonly known as: CYANOCOBALAMIN Take 1 tablet (100 mcg total) by mouth daily.       History (reviewed): Past Medical History:  Diagnosis Date  . AAA (abdominal aortic aneurysm) ([place])   . Anemia   . Anxiety   . Arthritis   . Asthma   . Diabetes mellitus without complication ([place])   . Hyperlipidemia   . Hypertension    Past Surgical History:  Procedure Laterality Date  . ABDOMINAL HYSTERECTOMY    . APPENDECTOMY    . BUNIONECTOMY    . COLONOSCOPY N/A 11/09/2017   Procedure: COLONOSCOPY;  Surgeon: Danie Binder, MD;  Location: [facility];  Service: Endoscopy;  Laterality: N/A;  2:00  . GANGLION CYST EXCISION     Family History  Problem Relation Age of Onset  . AAA (abdominal aortic aneurysm) Mother   . Heart disease Mother   . Cancer Mother   . Dementia Mother   . Cancer Father   . Dementia Father   . Healthy Daughter   . Cancer Brother   . Cancer Brother   . Cancer Brother   . Cancer Brother        lung  . Healthy Brother   . Healthy Daughter    Social History   Socioeconomic History  . Marital status: Divorced    Spouse name: Not on file  . Number of children: 2  . Years of education: 52  . Highest education level: 12th grade  Occupational History  . Occupation: Retired    Comment: Lexicographer  . Financial resource strain: Not hard at all  . Food insecurity    Worry: Never true    Inability:  Never true  . Transportation needs    Medical: No    Non-medical: No  Tobacco Use  . Smoking status: Former Smoker    Types: Cigarettes    Quit date: 08/04/2012    Years since quitting: 6.5  . Smokeless tobacco: Never Used  Substance and Sexual Activity  . Alcohol use: No  . Drug use: No  . Sexual activity: Not Currently  Lifestyle  . Physical activity    Days per week: 0 days    Minutes per session: 0 min  . Stress: Not at all  Relationships  . Social connections    Talks on phone: More than three times a week    Gets together: More than three times a week    Attends religious service: More than 4 times per  year    Active member of club or organization: Yes    Attends meetings of clubs or organizations: More than 4 times per year    Relationship status: Divorced  Other Topics Concern  . Not on file  Social History Narrative  . Not on file    Activities of Daily Living In your present state of health, do you have any difficulty performing the following activities: 03/05/2019  Hearing? N  Vision? N  Difficulty concentrating or making decisions? N  Walking or climbing stairs? Y  Comment sometimes due to joint pain/stiffness related to Ra  Dressing or bathing? N  Doing errands, shopping? Y  Comment someone has to drive her to all appointments and to do errands  Preparing Food and eating ? N  Using the Toilet? N  In the past six months, have you accidently leaked urine? Y  Comment in the middle of the night she can't move fast enough to get to the bathroom due to her RA  Do you have problems with loss of bowel control? N  Managing your Medications? N  Managing your Finances? N  Housekeeping or managing your Housekeeping? N  Some recent data might be hidden    Patient Education/ Literacy How often do you need to have someone help you when you read instructions, pamphlets, or other written materials from your doctor or pharmacy?: 1 - Never What is the last grade level  you completed in school?: 12th grade  Exercise Current Exercise Habits: The patient does not participate in regular exercise at present, Exercise limited by: orthopedic condition(s)  Diet Patient reports consuming 2 meals a day and 1 snack(s) a day Patient reports that her primary diet is: Regular Patient reports that she does have regular access to food.   Depression Screen PHQ 2/9 Scores 03/05/2019 12/05/2018 09/02/2018 08/09/2018 02/27/2018 02/27/2018 02/04/2018  PHQ - 2 Score 0 0 0 0 0 0 0     Fall Risk Fall Risk  03/05/2019 09/02/2018 08/09/2018 02/27/2018 02/04/2018  Falls in the past year? 0 0 0 No No  Number falls in past yr: 0 - - - -  Injury with Fall? 0 - - - -  Risk for fall due to : Impaired mobility - - - -  Risk for fall due to: Comment due to her RA - - - -  Follow up Falls prevention discussed - - - -  Comment get rid of all throw rugs, adequate lighting in walkways and grabrails in the bathroom - - - -     Objective:  Anne Strong seemed alert and oriented and she participated appropriately during our telephone visit.  Blood Pressure Weight BMI  BP Readings from Last 3 Encounters:  09/09/18 125/80  09/02/18 116/77  08/09/18 133/83   Wt Readings from Last 3 Encounters:  09/09/18 175 lb (79.4 kg)  09/02/18 175 lb 3.2 oz (79.5 kg)  08/09/18 175 lb (79.4 kg)   BMI Readings from Last 1 Encounters:  09/09/18 31.50 kg/m    *Unable to obtain current vital signs, weight, and BMI due to telephone visit type  Hearing/Vision  . Delvia did not seem to have difficulty with hearing/understanding during the telephone conversation . Reports that she has not had a formal eye exam by an eye care professional within the past year . Reports that she has not had a formal hearing evaluation within the past year *Unable to fully assess hearing and vision during telephone visit type  Cognitive Function:  6CIT Screen 03/05/2019  What Year? 0 points  What month? 0 points  What time? 0  points  Count back from 20 0 points  Months in reverse 0 points  Repeat phrase 4 points  Total Score 4   (Normal:0-7, Significant for Dysfunction: >8)  Normal Cognitive Function Screening: Yes   Immunization & Health Maintenance Record  There is no immunization history on file for this patient.  Health Maintenance  Topic Date Due  . TETANUS/TDAP  12/20/1966  . PNA vac Low Risk Adult (1 of 2 - PCV13) 12/19/2012  . MAMMOGRAM  01/02/2018  . INFLUENZA VACCINE  02/08/2019  . COLONOSCOPY  11/10/2027  . DEXA SCAN  Completed  . Hepatitis C Screening  Completed       Assessment  This is a routine wellness examination for Anne Strong.  Health Maintenance: Due or Overdue Health Maintenance Due  Topic Date Due  . TETANUS/TDAP  12/20/1966  . PNA vac Low Risk Adult (1 of 2 - PCV13) 12/19/2012  . MAMMOGRAM  01/02/2018  . INFLUENZA VACCINE  02/08/2019    Anne Strong does not need a referral for Community Assistance: Care Management:   no Social Work:    no Prescription Assistance:  no Nutrition/Diabetes Education:  no   Plan:  Personalized Goals Goals Addressed            This Visit's Progress   . DIET - INCREASE WATER INTAKE       Try to drink 6-8 glasses of water daily.      Personalized Health Maintenance & Screening Recommendations  Pneumococcal vaccine  Influenza vaccine Td vaccine Screening mammography Shingles vaccine  Lung Cancer Screening Recommended: no (Low Dose CT Chest recommended if Age 77-80 years, 30 pack-year currently smoking OR have quit w/in past 15 years) Hepatitis C Screening recommended: no HIV Screening recommended: no  Advanced Directives: Written information was not prepared per patient's request.  Referrals & Orders No orders of the defined types were placed in this encounter.   Follow-up Plan . Follow-up with Baruch Gouty, FNP as planned . Schedule your screening mammogram as discussed . Consider Prevnar13, Flu, Shingles  and TDAP vaccines at your next visit with your PCP   I have personally reviewed and noted the following in the patient's chart:   . Medical and social history . Use of alcohol, tobacco or illicit drugs  . Current medications and supplements . Functional ability and status . Nutritional status . Physical activity . Advanced directives . List of other physicians . Hospitalizations, surgeries, and ER visits in previous 12 months . Vitals . Screenings to include cognitive, depression, and falls . Referrals and appointments  In addition, I have reviewed and discussed with Anne Strong certain preventive protocols, quality metrics, and best practice recommendations. A written personalized care plan for preventive services as well as general preventive health recommendations is available and can be mailed to the patient at her request.      Marylin Crosby, LPN  [phone]

## 2019-03-19 ENCOUNTER — Other Ambulatory Visit: Payer: Self-pay | Admitting: *Deleted

## 2019-03-19 ENCOUNTER — Other Ambulatory Visit: Payer: Self-pay | Admitting: Family Medicine

## 2019-03-19 DIAGNOSIS — I10 Essential (primary) hypertension: Secondary | ICD-10-CM

## 2019-03-19 DIAGNOSIS — I700 Atherosclerosis of aorta: Secondary | ICD-10-CM

## 2019-03-19 DIAGNOSIS — E782 Mixed hyperlipidemia: Secondary | ICD-10-CM

## 2019-03-19 DIAGNOSIS — J4530 Mild persistent asthma, uncomplicated: Secondary | ICD-10-CM

## 2019-03-19 MED ORDER — VITAMIN C 250 MG PO TABS: 250.0000 mg | ORAL_TABLET | Freq: Every day | ORAL | 1 refills | Status: AC

## 2019-03-19 NOTE — Telephone Encounter (Signed)
Refill sent to [facility]

## 2019-03-19 NOTE — Telephone Encounter (Signed)
I do not see this on med list but guess ok to fill

## 2019-03-19 NOTE — Telephone Encounter (Signed)
Fax from [facility] RF request for Vitamin C 250 mg tab 1 QD

## 2019-04-15 ENCOUNTER — Other Ambulatory Visit: Payer: Self-pay | Admitting: Family Medicine

## 2019-04-15 DIAGNOSIS — M069 Rheumatoid arthritis, unspecified: Secondary | ICD-10-CM

## 2019-05-11 DEATH — deceased

## 2019-05-15 ENCOUNTER — Other Ambulatory Visit: Payer: Self-pay | Admitting: Family Medicine

## 2019-05-15 DIAGNOSIS — M069 Rheumatoid arthritis, unspecified: Secondary | ICD-10-CM

## 2019-11-20 ENCOUNTER — Telehealth: Payer: Self-pay | Admitting: Family Medicine

## 2019-11-20 NOTE — Telephone Encounter (Signed)
Pts daughter called requesting to speak with Tye [place] regarding death certificate for patient. Daughter can be reached at [phone] Anne Strong)

## 2019-11-20 NOTE — Telephone Encounter (Signed)
She gave me name and number of Colletta Maryland with registration corrections at [phone] to talk to about correcting patient's date of death on death certificate

## 2019-11-21 NOTE — Telephone Encounter (Signed)
Anne Strong aware letter emailed to [email or web address] at her request
